# Patient Record
Sex: Male | Born: 1935 | Race: White | Hispanic: No | Marital: Married | Smoking: Never smoker
Health system: Southern US, Community
[De-identification: ages and names within clinical notes are randomized; demographics above are authoritative.]

## PROBLEM LIST (undated history)

## (undated) DIAGNOSIS — Z87828 Personal history of other (healed) physical injury and trauma: Secondary | ICD-10-CM

## (undated) DIAGNOSIS — I701 Atherosclerosis of renal artery: Secondary | ICD-10-CM

## (undated) DIAGNOSIS — R911 Solitary pulmonary nodule: Secondary | ICD-10-CM

## (undated) DIAGNOSIS — K08109 Complete loss of teeth, unspecified cause, unspecified class: Secondary | ICD-10-CM

## (undated) DIAGNOSIS — Z974 Presence of external hearing-aid: Secondary | ICD-10-CM

## (undated) DIAGNOSIS — E291 Testicular hypofunction: Secondary | ICD-10-CM

## (undated) DIAGNOSIS — N400 Enlarged prostate without lower urinary tract symptoms: Secondary | ICD-10-CM

## (undated) DIAGNOSIS — I6930 Unspecified sequelae of cerebral infarction: Secondary | ICD-10-CM

## (undated) DIAGNOSIS — E7800 Pure hypercholesterolemia, unspecified: Secondary | ICD-10-CM

## (undated) DIAGNOSIS — Z860100 Personal history of colon polyps, unspecified: Secondary | ICD-10-CM

## (undated) DIAGNOSIS — Z862 Personal history of diseases of the blood and blood-forming organs and certain disorders involving the immune mechanism: Secondary | ICD-10-CM

## (undated) DIAGNOSIS — Z8673 Personal history of transient ischemic attack (TIA), and cerebral infarction without residual deficits: Secondary | ICD-10-CM

## (undated) DIAGNOSIS — M1990 Unspecified osteoarthritis, unspecified site: Secondary | ICD-10-CM

## (undated) DIAGNOSIS — Z972 Presence of dental prosthetic device (complete) (partial): Secondary | ICD-10-CM

## (undated) DIAGNOSIS — Z955 Presence of coronary angioplasty implant and graft: Secondary | ICD-10-CM

## (undated) DIAGNOSIS — Z8672 Personal history of thrombophlebitis: Secondary | ICD-10-CM

## (undated) DIAGNOSIS — I779 Disorder of arteries and arterioles, unspecified: Secondary | ICD-10-CM

## (undated) DIAGNOSIS — R269 Unspecified abnormalities of gait and mobility: Secondary | ICD-10-CM

## (undated) DIAGNOSIS — I739 Peripheral vascular disease, unspecified: Secondary | ICD-10-CM

## (undated) DIAGNOSIS — Z800 Family history of malignant neoplasm of digestive organs: Secondary | ICD-10-CM

## (undated) DIAGNOSIS — C61 Malignant neoplasm of prostate: Secondary | ICD-10-CM

## (undated) DIAGNOSIS — Z86718 Personal history of other venous thrombosis and embolism: Secondary | ICD-10-CM

## (undated) DIAGNOSIS — Z8669 Personal history of other diseases of the nervous system and sense organs: Secondary | ICD-10-CM

## (undated) DIAGNOSIS — R32 Unspecified urinary incontinence: Secondary | ICD-10-CM

## (undated) DIAGNOSIS — R600 Localized edema: Secondary | ICD-10-CM

## (undated) DIAGNOSIS — I2510 Atherosclerotic heart disease of native coronary artery without angina pectoris: Secondary | ICD-10-CM

## (undated) DIAGNOSIS — N1330 Unspecified hydronephrosis: Secondary | ICD-10-CM

## (undated) HISTORY — DX: Family history of malignant neoplasm of digestive organs: Z80.0

## (undated) HISTORY — PX: CORONARY ANGIOPLASTY WITH STENT PLACEMENT: SHX49

## (undated) HISTORY — DX: Atherosclerosis of renal artery: I70.1

## (undated) HISTORY — PX: TONSILLECTOMY: SUR1361

## (undated) HISTORY — PX: TRANSLUMINAL ANGIOPLASTY: SHX274

## (undated) HISTORY — PX: CATARACT EXTRACTION W/ INTRAOCULAR LENS  IMPLANT, BILATERAL: SHX1307

## (undated) HISTORY — DX: Atherosclerotic heart disease of native coronary artery without angina pectoris: I25.10

## (undated) HISTORY — DX: Malignant neoplasm of prostate: C61

## (undated) HISTORY — DX: Pure hypercholesterolemia, unspecified: E78.00

## (undated) HISTORY — PX: CARDIOVASCULAR STRESS TEST: SHX262

## (undated) HISTORY — DX: Peripheral vascular disease, unspecified: I73.9

## (undated) HISTORY — DX: Solitary pulmonary nodule: R91.1

## (undated) HISTORY — DX: Disorder of arteries and arterioles, unspecified: I77.9

## (undated) HISTORY — PX: SUBDURAL HEMATOMA EVACUATION VIA CRANIOTOMY: SUR319

---

## 2000-06-10 ENCOUNTER — Encounter (INDEPENDENT_AMBULATORY_CARE_PROVIDER_SITE_OTHER): Payer: Self-pay | Admitting: Specialist

## 2000-06-10 ENCOUNTER — Other Ambulatory Visit: Admission: RE | Admit: 2000-06-10 | Discharge: 2000-06-10 | Payer: Self-pay | Admitting: Urology

## 2000-06-17 ENCOUNTER — Encounter: Admission: RE | Admit: 2000-06-17 | Discharge: 2000-06-17 | Payer: Self-pay | Admitting: Urology

## 2000-06-17 ENCOUNTER — Encounter: Payer: Self-pay | Admitting: Urology

## 2000-06-21 ENCOUNTER — Encounter: Admission: RE | Admit: 2000-06-21 | Discharge: 2000-09-19 | Payer: Self-pay | Admitting: Radiation Oncology

## 2001-02-27 ENCOUNTER — Ambulatory Visit (HOSPITAL_COMMUNITY): Admission: RE | Admit: 2001-02-27 | Discharge: 2001-02-27 | Payer: Self-pay | Admitting: Gastroenterology

## 2001-02-27 ENCOUNTER — Encounter (INDEPENDENT_AMBULATORY_CARE_PROVIDER_SITE_OTHER): Payer: Self-pay

## 2001-04-03 ENCOUNTER — Ambulatory Visit: Admission: RE | Admit: 2001-04-03 | Discharge: 2001-07-02 | Payer: Self-pay | Admitting: Radiation Oncology

## 2001-04-07 ENCOUNTER — Encounter: Payer: Self-pay | Admitting: Urology

## 2001-04-07 ENCOUNTER — Encounter: Admission: RE | Admit: 2001-04-07 | Discharge: 2001-04-07 | Payer: Self-pay | Admitting: Urology

## 2001-07-03 ENCOUNTER — Ambulatory Visit: Admission: RE | Admit: 2001-07-03 | Discharge: 2001-10-01 | Payer: Self-pay | Admitting: Radiation Oncology

## 2003-08-03 ENCOUNTER — Emergency Department (HOSPITAL_COMMUNITY): Admission: EM | Admit: 2003-08-03 | Discharge: 2003-08-03 | Payer: Self-pay | Admitting: Emergency Medicine

## 2003-08-04 ENCOUNTER — Inpatient Hospital Stay (HOSPITAL_COMMUNITY): Admission: EM | Admit: 2003-08-04 | Discharge: 2003-08-07 | Payer: Self-pay | Admitting: Emergency Medicine

## 2003-08-05 ENCOUNTER — Encounter (INDEPENDENT_AMBULATORY_CARE_PROVIDER_SITE_OTHER): Payer: Self-pay | Admitting: Cardiology

## 2003-08-07 ENCOUNTER — Inpatient Hospital Stay (HOSPITAL_COMMUNITY)
Admission: RE | Admit: 2003-08-07 | Discharge: 2003-08-27 | Payer: Self-pay | Admitting: Physical Medicine & Rehabilitation

## 2003-09-03 ENCOUNTER — Encounter
Admission: RE | Admit: 2003-09-03 | Discharge: 2003-12-02 | Payer: Self-pay | Admitting: Physical Medicine & Rehabilitation

## 2003-10-03 ENCOUNTER — Encounter
Admission: RE | Admit: 2003-10-03 | Discharge: 2004-01-01 | Payer: Self-pay | Admitting: Physical Medicine & Rehabilitation

## 2004-01-16 ENCOUNTER — Encounter
Admission: RE | Admit: 2004-01-16 | Discharge: 2004-04-15 | Payer: Self-pay | Admitting: Physical Medicine & Rehabilitation

## 2006-02-21 ENCOUNTER — Ambulatory Visit: Payer: Self-pay | Admitting: Pulmonary Disease

## 2006-02-21 ENCOUNTER — Inpatient Hospital Stay (HOSPITAL_COMMUNITY): Admission: EM | Admit: 2006-02-21 | Discharge: 2006-03-01 | Payer: Self-pay | Admitting: Emergency Medicine

## 2006-02-23 ENCOUNTER — Encounter (INDEPENDENT_AMBULATORY_CARE_PROVIDER_SITE_OTHER): Payer: Self-pay | Admitting: Cardiology

## 2007-09-21 ENCOUNTER — Emergency Department (HOSPITAL_COMMUNITY): Admission: EM | Admit: 2007-09-21 | Discharge: 2007-09-21 | Payer: Self-pay | Admitting: Emergency Medicine

## 2007-10-17 ENCOUNTER — Inpatient Hospital Stay (HOSPITAL_COMMUNITY): Admission: RE | Admit: 2007-10-17 | Discharge: 2007-10-18 | Payer: Self-pay | Admitting: Cardiology

## 2007-10-19 ENCOUNTER — Ambulatory Visit: Payer: Self-pay | Admitting: Vascular Surgery

## 2007-10-19 ENCOUNTER — Encounter (INDEPENDENT_AMBULATORY_CARE_PROVIDER_SITE_OTHER): Payer: Self-pay | Admitting: Cardiology

## 2007-10-19 ENCOUNTER — Ambulatory Visit (HOSPITAL_COMMUNITY): Admission: RE | Admit: 2007-10-19 | Discharge: 2007-10-19 | Payer: Self-pay | Admitting: Cardiology

## 2007-10-20 ENCOUNTER — Encounter: Admission: RE | Admit: 2007-10-20 | Discharge: 2007-10-20 | Payer: Self-pay | Admitting: Geriatric Medicine

## 2007-11-09 ENCOUNTER — Inpatient Hospital Stay (HOSPITAL_COMMUNITY): Admission: AD | Admit: 2007-11-09 | Discharge: 2007-11-10 | Payer: Self-pay | Admitting: Interventional Cardiology

## 2008-04-29 ENCOUNTER — Encounter: Admission: RE | Admit: 2008-04-29 | Discharge: 2008-04-29 | Payer: Self-pay | Admitting: Geriatric Medicine

## 2008-06-30 ENCOUNTER — Emergency Department (HOSPITAL_COMMUNITY): Admission: EM | Admit: 2008-06-30 | Discharge: 2008-06-30 | Payer: Self-pay | Admitting: Emergency Medicine

## 2009-02-27 HISTORY — PX: TRANSTHORACIC ECHOCARDIOGRAM: SHX275

## 2009-11-13 IMAGING — CT CT CHEST W/O CM
2 of 3 series · 15 of 36 positions shown, 18 images · non-contrast
Comparison: None

CLINICAL DATA: Follow up right middle lobe nodule.

CT CHEST WITHOUT CONTRAST
TECHNIQUE: Multidetector CT imaging of the chest was performed
following the standard protocol without IV contrast.

[Series 3: routine chest · axial · 0.70mm/px · z∈[-318,-58]mm · 12 of 62 slices shown, 15 images]
[im 5/62  mediastinal]
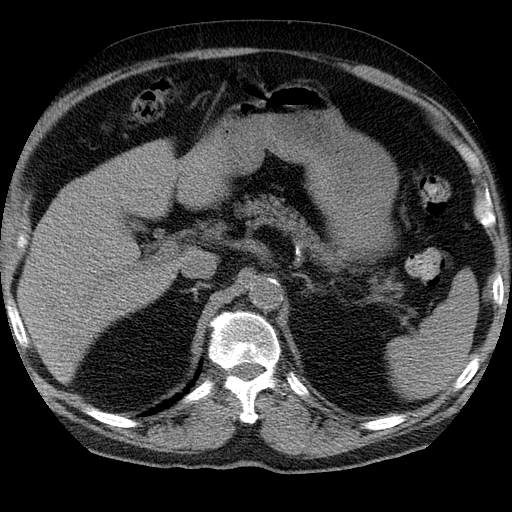
[im 5/62  lung]
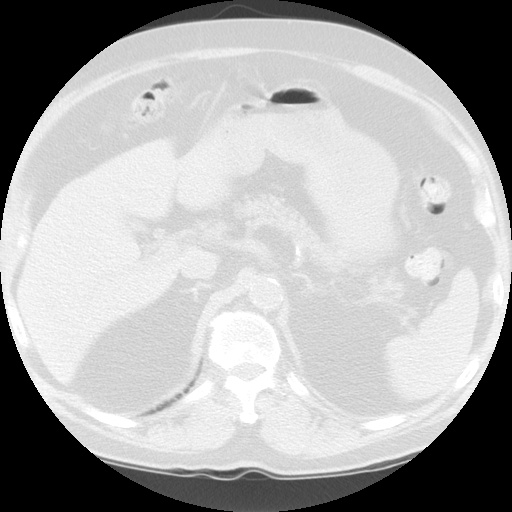
[im 10/62  lung]
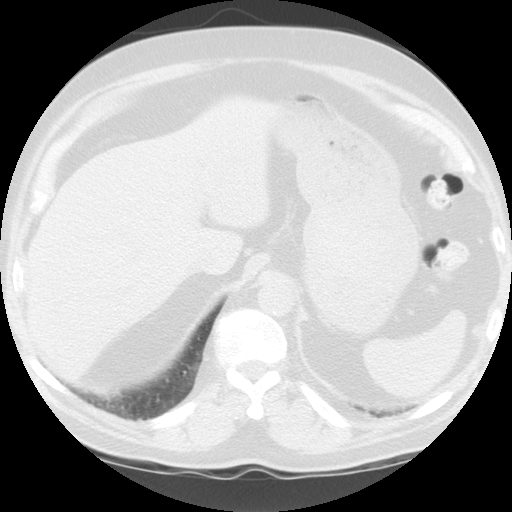
[im 14/62  lung]
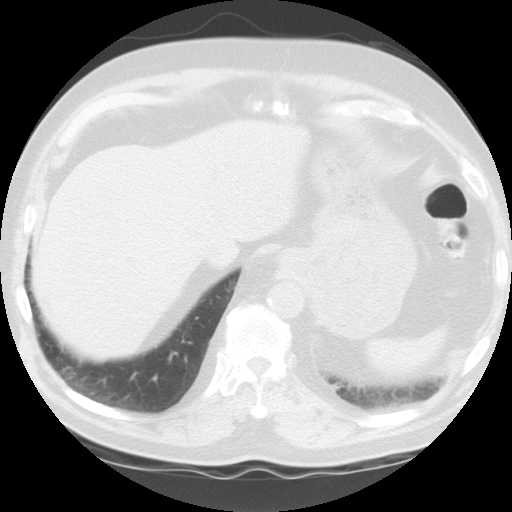
[im 19/62  lung]
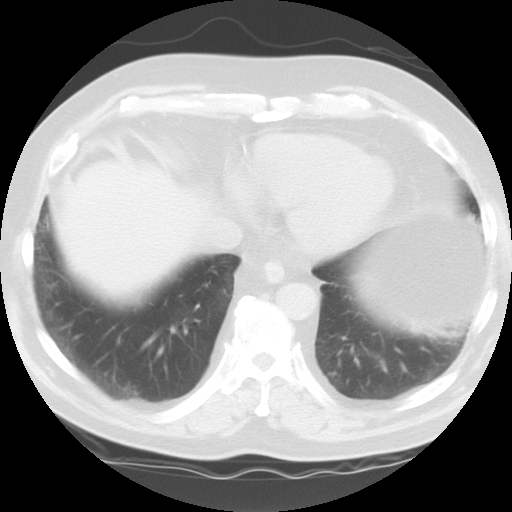
[im 23/62  mediastinal]
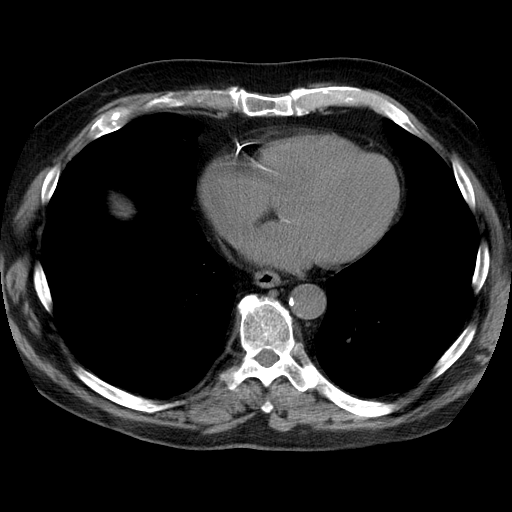
[im 23/62  lung]
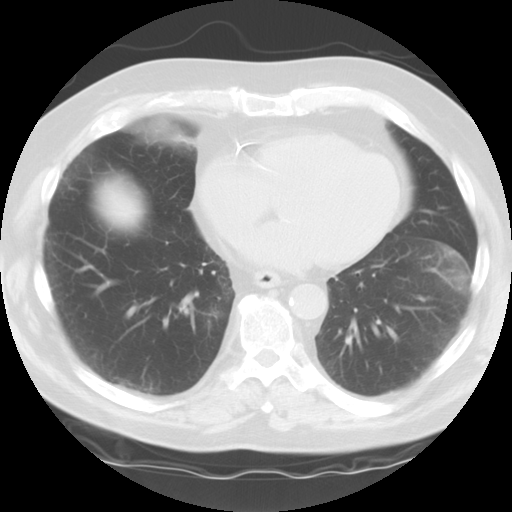
[im 28/62  lung]
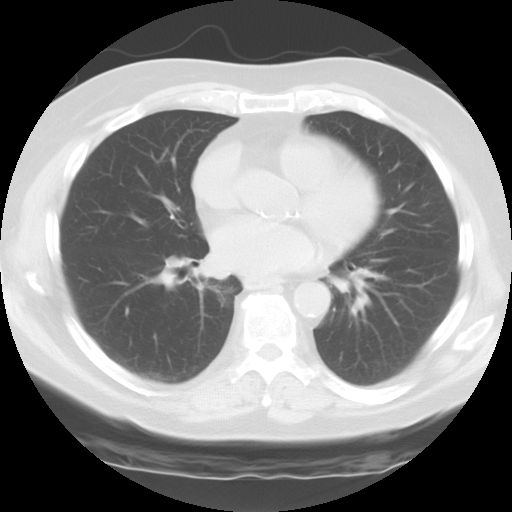
[im 34/62  lung]
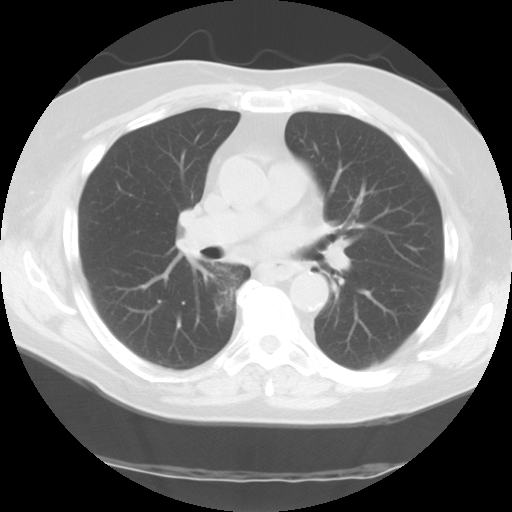
[im 39/62  lung]
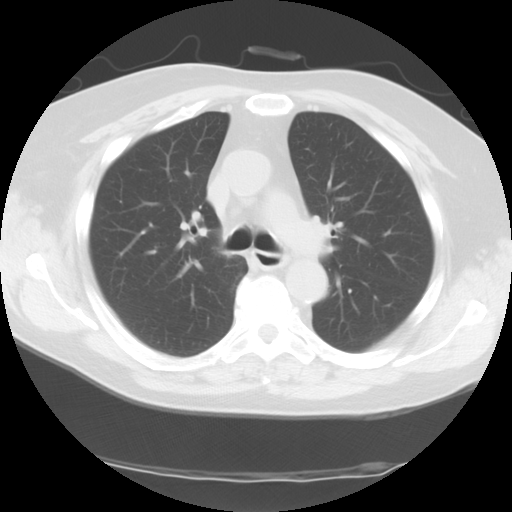
[im 43/62  mediastinal]
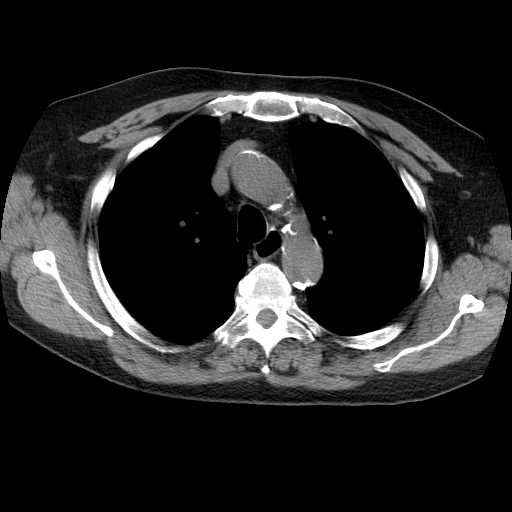
[im 43/62  lung]
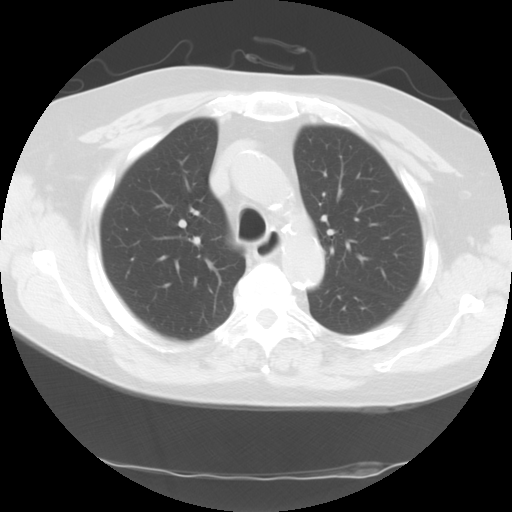
[im 48/62  lung]
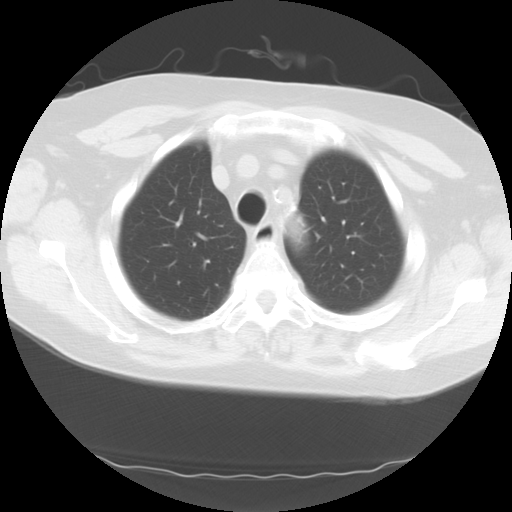
[im 52/62  lung]
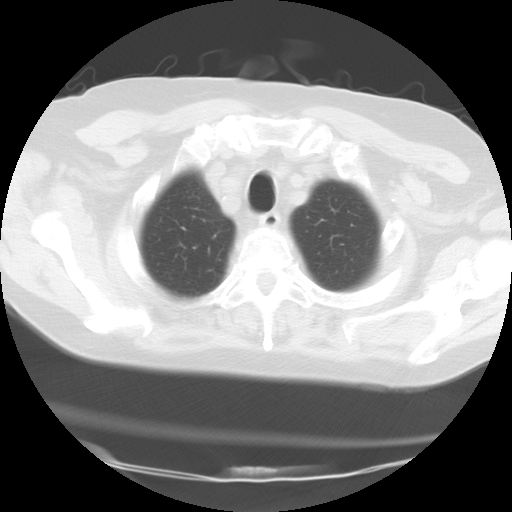
[im 57/62  lung]
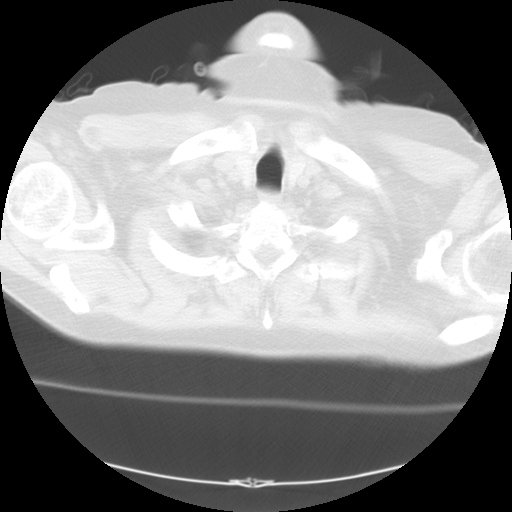

[Series 602: sagittal body · sagittal · 0.70mm/px · 3 of 145 slices shown]
[im 29/145  lung]
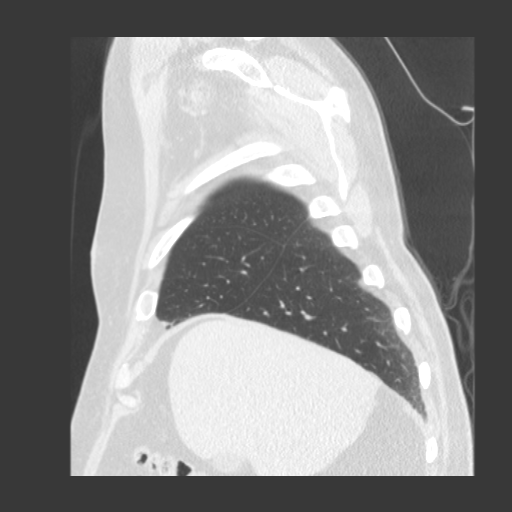
[im 58/145  lung]
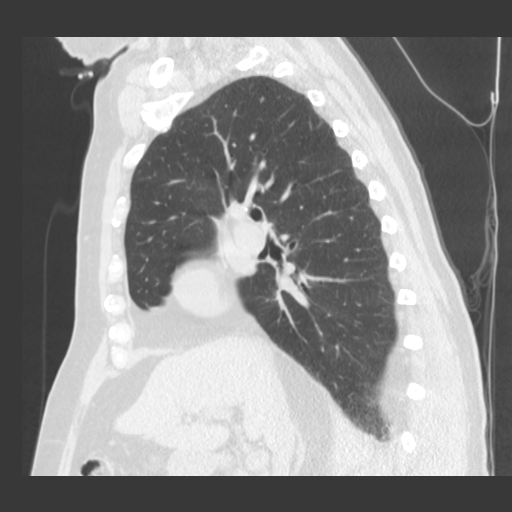
[im 87/145  lung]
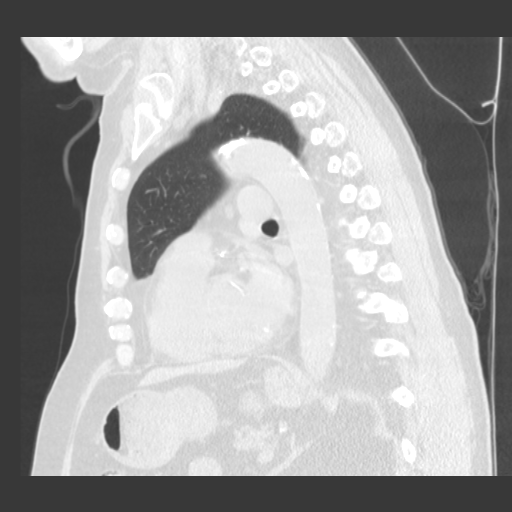

[15 of 36 positions shown; findings below may reference images not displayed]

FINDINGS: No enlarged axillary lymph nodes.

There are no enlarged supraclavicular lymph nodes

Calcified right paratracheal lymph node is noted.

There is advanced calcified atherosclerotic disease affecting the
coronary arteries and the thoracic aorta.

No pathologically enlarged mediastinal or hilar lymph nodes are
noted.

There is no pericardial or pleural fluid

The left lung is clear.

There is a nodule within the right middle lobe which measures
mm, image 36.

Right upper lobe nodule measures 5.7 mm.

There is a calcified granuloma in the right upper lobe, image
number 31.

Review of the visualized osseous structures shows thoracic
spondylosis.
IMPRESSION: 1.  No acute findings.
2.  Right middle lobe nodule measures 5.7 mm.  If this patient is
at low risk for lung cancer I would recommend a follow-up CT at 12
months.  If this patient is at high risk for lung cancer an initial
follow-up exam in 6 months is recommended.

## 2010-07-14 ENCOUNTER — Other Ambulatory Visit (HOSPITAL_COMMUNITY): Payer: Self-pay | Admitting: Geriatric Medicine

## 2010-07-14 DIAGNOSIS — K043 Abnormal hard tissue formation in pulp: Secondary | ICD-10-CM

## 2010-07-30 ENCOUNTER — Ambulatory Visit (HOSPITAL_COMMUNITY)
Admission: RE | Admit: 2010-07-30 | Discharge: 2010-07-30 | Disposition: A | Discharge status: Home / Self Care | Payer: Medicare Other | Source: Ambulatory Visit | Attending: Geriatric Medicine | Admitting: Geriatric Medicine

## 2010-07-30 DIAGNOSIS — M533 Sacrococcygeal disorders, not elsewhere classified: Secondary | ICD-10-CM | POA: Insufficient documentation

## 2010-07-30 DIAGNOSIS — Z8546 Personal history of malignant neoplasm of prostate: Secondary | ICD-10-CM | POA: Insufficient documentation

## 2010-07-30 DIAGNOSIS — K043 Abnormal hard tissue formation in pulp: Secondary | ICD-10-CM

## 2010-07-30 MED ORDER — TECHNETIUM TC 99M MEDRONATE IV KIT
25.0000 | PACK | Freq: Once | INTRAVENOUS | Status: AC | PRN
  Administered 2010-07-30: 27.5 via INTRAVENOUS

## 2010-09-14 LAB — COMPREHENSIVE METABOLIC PANEL
ALT: 31 U/L (ref 0–53)
AST: 26 U/L (ref 0–37)
CO2: 27 mEq/L (ref 19–32)
Chloride: 104 mEq/L (ref 96–112)
GFR calc Af Amer: >60 mL/min (ref >60)
GFR calc non Af Amer: 59 mL/min — ABNORMAL LOW (ref >60)
Sodium: 139 mEq/L (ref 135–145)
Total Bilirubin: 0.6 mg/dL (ref 0.3–1.2)

## 2010-09-14 LAB — URINALYSIS, ROUTINE W REFLEX MICROSCOPIC
Glucose, UA: NEGATIVE mg/dL
Hgb urine dipstick: NEGATIVE
Ketones, ur: NEGATIVE mg/dL
Protein, ur: NEGATIVE mg/dL

## 2010-09-14 LAB — POCT CARDIAC MARKERS
CKMB, poc: <1 ng/mL — ABNORMAL LOW (ref 1.0–8.0)
Troponin i, poc: <0.05 ng/mL (ref 0.00–0.09)
Troponin i, poc: <0.05 ng/mL (ref 0.00–0.09)

## 2010-09-14 LAB — CBC
MCV: 92.4 fL (ref 78.0–100.0)
RBC: 4.2 MIL/uL — ABNORMAL LOW (ref 4.22–5.81)
WBC: 8.3 10*3/uL (ref 4.0–10.5)

## 2010-10-13 NOTE — Op Note (Signed)
NAMEKOLBE, DELMONACO NO.:  [id number]   MEDICAL RECORD NO.:  [id number]          PATIENT TYPE:  INP   LOCATION:  6525                         FACILITY:  [facility]   PHYSICIAN:  Corky Crafts, MDDATE OF BIRTH:  [birth year]   DATE OF PROCEDURE:  11/09/2007  DATE OF DISCHARGE:                               OPERATIVE REPORT   PROCEDURE:  Abdominal aortogram.  Bilateral renal angiogram with PTA  stents to bilateral renal arteries.   REFERRING PHYSICIAN:  Francisca December, MD   PROCEDURES PERFORMED:  1. Abdominal aortogram.  2. Bilateral renal angiogram.  3. Bilateral renal percutaneous transluminal angioplasty/stent.   OPERATOR:  Corky Crafts, MD   INDICATIONS:  Hypertension and renal insufficiency.   PROCEDURE NOTE:  The patient was brought to the [facility] lab with diagnostic  angiogram had been performed by Dr. Amil Amen several weeks ago showing  bilateral renal artery stenosis with pressure damping.  A 6-French  sheath was placed in the right femoral artery, a pigtail catheter was  advanced into the into the abdominal aorta and power injection of  contrast was performed in the AP projection to image the infrarenal  aorta.  A 6-French short IMA guide was then advanced and placed into the  ostium of the right renal artery.  A stabilizer wire was placed across  the lesion after the 80% ostial lesion and pressure damping was noted.  A 5.0 x 15 mm Viatrac balloon was inflated to 8 atmospheres for 20  seconds and then 10 atmospheres for 30 seconds.  A 6.0 x 15 Herculink  stent was then placed into the ostium of the right renal artery and  deployed at 10 atmospheres for 40 seconds.  The proximal portion of the  stent was flared at 12 atmospheres for 10 seconds.  The guide was then  placed into the ostium of the left renal artery, the same 5.0 x 15  Viatrac balloon was inflated across the lesion to 10 atmospheres for 20  seconds.  This was a calcified 70% lesion  and there is approximately 40-  mm gradient prior to the angioplasty.  A 6.0 x 15-mm Herculink stent was  then placed across the lesion and deployed at 8 atmospheres for 20  seconds and ostium was ballooned at 10 atmospheres for 15 seconds.  There is an excellent angiographic result.   IMPRESSION:  Successful 6.0 x 15 stents placed into the ostium of both  the right and left renal arteries with excellent angiographic results.   RECOMMENDATIONS:  We will watch the patient overnight and hydrate him  aggressively.  We will consider starting an ACE inhibitor at a later  time if his blood pressure still requires medication.  A sheath will be  pulled using manual compression.  Heparin was used for anticoagulation.  He was planned to be discharged tomorrow.      Corky Crafts, MD  Electronically Signed     JSV/MEDQ  D:  11/09/2007  T:  11/09/2007  Job:  [id number]   cc:   Hal T. Stoneking, M.D.
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
## 2010-10-16 NOTE — Discharge Summary (Signed)
[name], [name]                 ACCOUNT NO.:  [id number]   MEDICAL RECORD NO.:  [id number]          PATIENT TYPE:  INP   LOCATION:  2006                         FACILITY:  [facility]   PHYSICIAN:  Hollice Espy, M.D.DATE OF BIRTH:  Jul 01, 1921   DATE OF ADMISSION:  02/21/2006  DATE OF DISCHARGE:  03/01/2006                                 DISCHARGE SUMMARY   PRIMARY CARE PHYSICIAN:  Dr. Ann Maki T. Stoneking.   CONSULTANTS ON THIS CASE:  Dr.  Danice Goltz,  critical care medicine, and  Dr. Verdis Prime, cardiology.   DISCHARGE DIAGNOSES:  1. Retroperitoneal bleed.  2. Supertherapeutic Coumadin level causing #1.  3. Syncope secondary to #1.  4. Episode of ventricular fibrillation.  5. Hypokalemia.  6. History of hyperlipidemia.  7. History of hypertension.  8. History of transient ischemic attack.  9. History of deep venous thrombosis and phlebitis leading to chronic      Coumadin therapy.  10.Hearing loss.  11.History of head injury.   DISCHARGE MEDICATIONS:  Patient will be stopping his Coumadin medication at  this time.  Whether he will be continuing this medication will be re-  determined by his PCP at a later date.  He will resume the rest of his  medications.  These are as follows:  1. Aspirin 81 p.o. daily.  2. Zetia 10 p.o. daily.  3. Hydrochlorothiazide 25 p.o. daily.  4. Lipitor 40 p.o. daily.  5. Altace 5 mg p.o. daily.   HOSPITAL COURSE:  The patient is an 77 year old white male with past medical  history of DVT and thrombophlebitis, on chronic Coumadin therapy, who  according to his PCP has always done very well with Coumadin.  He has been  on Coumadin now for almost 30 years.  His INR has always stayed very close  to almost 2.4 at the same dose.  For some unknown reason, unbeknownst to  them, his INR greatly elevated -  up to 10.2, even though it had been  checked a week to a week and a half earlier and was normal.  He had a  syncopal episode on the day of  admission, by his wife.  He was sitting on  the steps when he became nauseated, felt clammy, and slumped over.  Patient  was brought to the emergency room and was found to have an INR of 10.2, and  after being evaluated, had a hemoglobin of 1.7.  He was also complaining of  some abdominal pain and with a negative KUB, there was a concern that  perhaps he was having an episode of bleeding.  Patient underwent a CT scan  which showed evidence of a retroperitoneal bleed.  The patient was admitted  and was started on FFP and vitamin K.   HOSPITAL COURSE:  Regarding the patient's retroperitoneal bleed, patient  continued to receive units of blood as well as doses of FFP.  He eventually  tolerated this procedure well.  He was initially intubated because of an  episode of dysrhythmia described below, but from a bleeding standpoint, did  well.  He remained  stable, and once his H&H was recovered, he tolerated this  well.  He remained stable during his hospitalization and had no further  episodes of bleeding.  I have discussed with the family.  We have agreed  that he will for now stop Coumadin and whether he will restart on this will  be determined by his PCP at a later date.  As far as the cause of the INR  toxicity, there is no clear etiology why.  He had been on no new  medications, had no liver issues, and it is still unclear, but again for now  we will stop his Coumadin.  He has had no further problem.   In regard to patient's episode of ventricular Fibrillation, patient was  receiving FFP on hospital day #1 when all of a sudden he went into  ventricular fibrillation for 2 minutes.  A code was called and he was  shocked at 120 joules and given oxygen.  He immediately returned back to his  normal sinus rate.  Because of this episode, the V-fib plus retroperitoneal  bled, it was felt that he would best be intubated for his own protection.  This was done by anesthesia.  Patient was initially  placed on a ventilator.  By hospital day #2, he was able to be extubated without any complications.  He remained in normal sinus rhythm during the rest of the entire  hospitalization.  The cause of it was felt to be secondary to hypokalemia.  Patient initially on presentation had a potassium of 3.5 but re-do potassium  showed a level of 2.7.  It was suspected that perhaps with the large amount  of IV fluid that he was given as well as being hypervolemic and having  decreased blood supply may have contributed to this.  Again, he remained in  normal sinus rhythm, and once he was stabilized, he was able to undergo a  cardiac stress test, adenosine Cardiolite on March 01, 2006.  At this time  I am waiting for the results to come back and if the stress test is  negative, the plan will be to discharge the patient to home.   The rest of patient's medical issues including history of TIA, hypertension,  hyperlipidemia, all remained stable during his hospitalization, and these  were stable.   PLAN:  To discharge patient to home if his stress test is negative.  His  diet will be a heart-healthy diet.  His activity will be as tolerated.  He  was evaluated by PT/OT and found to be at his baseline, not needing any home  health.   DISPOSITION:  Improved, and he will be discharged to home pending clearance  of his stress test.      Hollice Espy, M.D.  Electronically Signed     SKK/MEDQ  D:  03/01/2006  T:  03/02/2006  Job:  [id number]   cc:   Hal T. Stoneking, M.D.  Lyn Records, M.D.  Dr.  ****, critical care medicine

## 2010-10-16 NOTE — Assessment & Plan Note (Signed)
MEDICAL RECORD NUMBER:  [id number]   Mr. [name] is here in follow-up of his right parietal infarct with left  hemiparesis.  He was discharged from [facility] on August 27, 2003, and preceded to go to outpatient therapies which he is still  involved with.  He has been doing quite well at home.  He is walking with  his solid AFO.  He has been doing some trials in ambulation without the  brace in therapy.  He does not walk without this at home.  He denies any  falls or dizziness.  He has had no new numbness, weakness, or spasms.  Bowel  and bladder function have been normal.  Mood has been good.  There are no  visual complaints. He does state that he fatigues fairly easily.  He has  complained of some left hip pain although this is chronic.  Left shoulder  has been bothersome particularly intrascapularly as well as subacromially.  He is not really using anything for pain at this point.   REVIEW OF SYMPTOMS:  The patient denies any chest pain, shortness of breath,  cold or flu symptoms.  He denies any headaches, mental status changes or  mood changes.  Denies any nausea, vomiting, reflux, bowel or bladder  difficulties.  No weight loss, fever or chills.  He has added some weight  since discharge.   PHYSICAL EXAMINATION:  VITAL SIGNS:  Blood pressure is 146/43, pulse 8,  respiratory rate 14, oxygen saturation 96% on room air.   The patient ambulated today with his brace and had some circumduction in his  gait and was very straight legged in swing phase.  He had minimal toe lift.  Was slightly wide based in style.  He was fairly stable and steady, however.  We removed the brace and the patient walked today with cues, was better with  ankle dorsiflexion and knee bend and seemed to be a bit more fluid.  Muscle  strength on the left side was 3+ to 4/5 in the leg, essentially the same in  the left arm with a little less strength in the left hand.  He was  restricted in range of  motion with intrinsic use due to some slight  swelling.  Left ankle could be moved passively to about 90 degrees.  He had  clonus notable there with multiple beats.  Reflexes were 3+ on the left  side.  Overall, tone was 0 to 1/4.  Sensory examination was generally  intact.  Cranial nerve examination was appropriate with no focal findings.  He may have had a mild left central seventh.  Cognition was intact.  Left  shoulder was ranged today and he had some impingement with passive abduction  and was blocking at about 90 degrees.  Internal rotation and external  rotation caused some discomfort today.  He had trigger point over the  trapezius muscle along the medial border of the scapula on the left side.  Neck range of motion was good.  Left hip revealed no palpable tenderness.  Greater trochanter region was nontender.  He had some pain with internal and  external rotation.  No obvious asymmetries are noted today.   ASSESSMENT:  1. Right parietal infarct.  2. Gait disorder.  3. Hypertension.  4. Osteoarthritis of the left hip.  5. Mild myofascial pain in the left trapezius.   PLAN:  1. The patient will continue with his outpatient therapy at this point.  I  would like to see him transition off of his AFO over the next few weeks'     time.  2. I will discuss some ankle stretching exercises as well for his mild heel     cord contracture.  3. We considered a trigger point injection to the left trapezius today but     will hold off and pursue range of motion and strengthening through     therapy on the left side.  4. Recommended Tylenol prior to ambulation for his degenerative hip disease.     Hopefully this will improve     somewhat as his gait stabilizes.  5. I will see him back in about three months' time.  I did not give him     permission to drive today.      [name], M.D.   ZTS/MedQ  D:  10/07/2003 12:56:53  T:  10/07/2003 13:32:02  Job #:  [id number]   cc:   Hal  T. Stoneking, M.D.  [address] [place], [place] [address]  Fax: [phone]
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
## 2010-10-16 NOTE — Procedures (Signed)
[facility]  Patient:    [name], [name] Visit Number: [id number] MRN: [id number]          Service Type: Attending:  Verlin Grills, M.D. Dictated by:   Verlin Grills, M.D. Proc. Date: 02/27/01   CC:         Hal T. Stoneking, M.D.   Procedure Report  PROCEDURE:  Colonoscopy and rectal polypectomy.  REFERRING PHYSICIAN:  Hal T. Stoneking, M.D.  INDICATIONS FOR PROCEDURE:  The patient (date of birth, [birth year]) is a 77 year old male who is referred for his first colonoscopy with polypectomy to prevent colon cancer.  The patients brother has undergone surgery for colon cancer.  In 1995, the patient underwent a flexible proctosigmoidoscopy and air contrast barium enema.  I discussed with the patient the complications associated with colonoscopy and polypectomy including a 15 per 1000 risk of bleeding, and 4 per 1000 risk of colonic perforation requiring surgical repair.  The patient has signed the operative permit.  ENDOSCOPIST:  Verlin Grills, M.D.  PREMEDICATION:  Versed 5 mg and Demerol 50 mg.  ENDOSCOPE:  Olympus pediatric colonoscope.  DESCRIPTION OF PROCEDURE:  After obtaining informed consent, the patient was placed in the left lateral decubitus position.  I administered intravenous Versed and intravenous Demerol to achieve conscious sedation for the procedure.  The patients blood pressure, oxygen saturation, and cardiac rhythm were monitored throughout the procedure and documented in the medical record.  Anal inspection was normal.  Digital rectal exam revealed a non-nodular prostate.  The patient does have prostate cancer diagnosed by Dr. Darvin Neighbours.  The Olympus pediatric colonoscope was introduced into the rectum and easily advanced to the cecum.  Colonic preparation for the exam today was excellent.  Rectum:  From the distal rectum, a 0.5 mm sessile polyp was removed with the cold biopsy forceps and  submitted for pathological interpretation.  Sigmoid colon and descending colon normal.  Splenic flexure normal.  Transverse colon normal.  Hepatic flexure normal.  Ascending colon normal.  Cecum and ileocecal valve normal.  ASSESSMENT:  From the distal rectum, a 0.5 mm sessile polyp was removed with the cold biopsy forceps.  Otherwise, normal proctocolonoscopy to the cecum.  RECOMMENDATIONS:  Repeat colonoscopy in approximately five years. Dictated by:   Verlin Grills, M.D. Attending:  Verlin Grills, M.D. DD:  02/27/01 TD:  02/27/01 Job: [id number]

## 2010-10-16 NOTE — Consult Note (Signed)
NAMEDRAYVEN, MARCHENA                 ACCOUNT NO.:  [id number]   MEDICAL RECORD NO.:  [id number]          PATIENT TYPE:  INP   LOCATION:  2902                         FACILITY:  [facility]   PHYSICIAN:  Ulyses Amor, MD DATE OF BIRTH:  09/18/1921   DATE OF CONSULTATION:  02/23/2006  DATE OF DISCHARGE:                                   CONSULTATION   TYPE OF CONSULTATION:  Cardiology.   HISTORY OF PRESENT ILLNESS:  [name] is an 77 year old, white man who  was initially admitted to [facility] 2 days ago because of a  syncopal episode.  In the course of evaluation he was found to be over  anticoagulated with an INR of greater than 10.2.  His hemoglobin and  hematocrit were 11.9 and 35 respectively.  Imaging studies today  demonstrated evidence of a retroperitoneal bleed.  He was in the process of  receiving fresh frozen plasma this evening when he suffered a witnessed and  telemetry documented ventricular fibrillation cardiac arrest.  One shock  converted him to normal sinus rhythm.  He remains in normal sinus rhythm and  otherwise stable at this point.  A cardiology consultation was requested in  order to assist in his management.   The patient has no past history of cardiac disease, including no history of  myocardial infarction, coronary artery disease, congestive heart failure, or  arrhythmia.   PAST MEDICAL HISTORY:  He has a prior history of a stroke.  Other medical  problems include hypertension and dyslipidemia.   MEDICATIONS:  Aspirin, Coumadin, Zetia, hydrochlorothiazide, Lipitor,  Altace.   ALLERGIES:  NONE.   REVIEW OF SYSTEMS:  Not obtained.   FAMILY HISTORY:  Not obtained.   PHYSICAL EXAMINATION:  VITAL SIGNS:  Blood pressure 124/58.  Pulse is 90 and  regular.  Respirations 17.  Temperature 96.8.  Pulse oximetry 98% on 2  liters.  GENERAL:  The patient was a sedated, elderly white man in no distress.  HEENT:  Head, eyes, nose, and mouth were  normal.  NECK:  Without thyromegaly or adenopathy.  Carotid pulses were palpable  bilaterally.  CARDIAC:  Normal S1 and S2.  There was no S3, S4, murmur, rub, or click.  Cardiac rhythm is regular.  No chest wall tenderness was noted.  LUNGS:  Clear.  ABDOMEN:  Soft and nontender.  There was mass, hepatosplenomegaly or bruit,  distention, rebound, guarding or rigidity.  EXTREMITIES:  Without edema, deviation or deformity.  Radial and dorsalis  pedal pulses were palpable bilaterally.  NEUROLOGIC:  Re-screening neurologic survey was unremarkable.   DIAGNOSTIC DATA:  The electrocardiogram revealed normal sinus rhythm.  The  possibility of an anterior myocardial infarction could not be excluded based  on the loss of R wave height between V2 and V3.   IMPRESSION:  1. Ventricular fibrillation cardiac arrest.  Possibly related to his      potassium today of 2.8.  It is possible an arrhythmia was responsible      for his syncopal episode (the reason for admission).  2. Over anticoagulation.  3. Retroperitoneal  bleed.   RECOMMENDATIONS:  1. Cardiac enzymes.  2. Correct potassium; should be between 4.0 and 5.0.  3. Check magnesium; correct if needed.  4. Echocardiogram to assess left ventricular function.  5. No specific antiarrhythmic therapy (such as lidocaine or amiodarone) at      this time; would see if he has further arrhythmias once his potassium      has been corrected.  6. Dr. Katrinka Blazing will follow with you.      Ulyses Amor, MD  Electronically Signed     MSC/MEDQ  D:  02/23/2006  T:  02/23/2006  Job:  [id number]   cc:   Ulyses Amor, MD  Lyn Records, M.D.
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
## 2010-10-16 NOTE — Discharge Summary (Signed)
NAME:  [name], [name]                           ACCOUNT NO.:  [id number]   MEDICAL RECORD NO.:  [id number]                   PATIENT TYPE:  INP   LOCATION:  3037                                 FACILITY:  [facility]   PHYSICIAN:  Kela Millin, M.D.             DATE OF BIRTH:  10-Aug-1921   DATE OF ADMISSION:  08/04/2003  DATE OF DISCHARGE:  08/07/2003                                 DISCHARGE SUMMARY   DISCHARGE DIAGNOSES:  1. Right pontine infarction - with left hemiparesis.  2. Internal carotid artery stenosis - left 60 to 80% greater than right,     which is 40 to 60%.  3. Hypertension.  4. Hyperlipidemia.  5. Chronic phlebitis.   PROCEDURE:  1. MRI/MRA done on August 04, 2003.  The MRI showed atrophy and chronic small     vessel ischemic changes and no acute infarct or acute abnormalities were     seen in that study.  The MRA showed moderate stenosis of the left     anterior cerebral artery and it was negative for aneurysm.  2. MRA of the neck - This showed severe stenosis of the origin of the left     common carotid artery with irregularity and mild to moderate stenosis of     the proximal left internal carotid artery.  3. Carotid Dopplers - Internal carotid artery stenosis with the left 60 to     80% stenosis greater than right, which is 40 to 60% in that internal     carotid artery.  4. A 2D echo - No echographic evidence of cardiac __________ embolism.   CONSULTATIONS:  Neurology, Dr. Pearlean Brownie.   HISTORY:  The patient is a 77 year old white male with past medical history  significant for hypertension, hyperlipidemia and remote history of a TIA who  presented with complaints of left-sided weakness and slurred speech.  The  patient had been to the emergency room on the day prior to admission, August 03, 2003, with slurred speech and some difficulty with his balance.  A CT  scan of the brain was done on that day and it showed an old frontal CVA, but  no acute findings.  His INR  on Coumadin was 2.3 and the patient improved  significantly towards his baseline that day and was discharged from the  emergency room that afternoon.  He did okay that night, except he continued  to have problems with his balance.  When he woke up on the day of admission,  he again had problems with weakness in the left arm and leg with difficulty  walking or using his left arm to brush his teeth.  His speech was also  slurred and he came back to the emergency room.  He had a mild headache,  which resolved.  He denied visual changes, numbness, paresthesias, chest  pain, shortness of breath, abdominal pain.  He also denied  hematuria and  hematochezia.   INITIAL PHYSICAL EXAMINATION:  VITAL SIGNS:  Blood pressure 134/57.  HEENT:  There was some mild decrease in his gag reflex on both sides.  EXTREMITIES:  It was noted he had hyperpigmentation in the lower legs.  NEUROLOGIC:  He was alert and oriented times three.  His speech was mildly  slurred and the cranial nerves were intact, except for mild left lower  facial weakness.  His tongue deviated slightly to the left.  The motor exam  showed the strength on his right upper and right lower extremities was 5/5  and the strength in the left upper and left lower extremities noted on that  initial exam was 5-/5 and the reflexes 2+/4 and toes were downgoing on that  exam.   SUBSEQUENT PHYSICAL EXAMINATION:  NEUROLOGIC:  After the patient was  admitted, the strength in the left upper and left lower extremities had  decreased to 1 to 2/5 in both left upper and right lower extremities.   His homocystine level was 9.88 which is within normal limits and his total  cholesterol was 180, the triglycerides 191, and his HDL was 34 with an LDL  of 108.   HOSPITAL COURSE:  PROBLEM 1.  Right pontine infarction - Upon admission, the  patient was continued on his Coumadin and aspirin was added.  An MRI and MRA  of the brain were ordered and the results were  as stated above.  The studies  were reviewed by the neurologist and they stated that this was consistent  with pontine ischemia/infarction.  A 2D echocardiogram as well as carotid  Dopplers were ordered as well and the results were as stated above.  The  patient's lipids were also rechecked and neurology recommended that his  Lipitor be increased and this was increased to 40 mg a day before he was  discharged to rehab.  Neurology was consulted, as mentioned already, and  physical therapy, occupational therapy as well as speech therapy were  consulted and they saw the patient during his hospital stay.  The patient  was maintained on his entire antihypertensive HCTZ throughout his hospital  stay for blood pressure control.  His INR was also therapeutic throughout  his hospital stay.  The patient had been tolerating p.o. and had remained  hemodynamically stable and rehab was consulted and the patient accepted.  [name] will be discharged to rehab at this time.  The strength in his  left upper and left lower extremities ranges from 1 to 2+/5.  PROBLEM 2.  Internal carotid artery stenosis - Left 60 to 80%, which is  greater than the right, which is 40 to 60%.  The patient is asymptomatic  from this at this time.  He will be following up with Dr. Pearlean Brownie, a  neurologist, in two months and this will be further evaluated and managed as  clinically appropriate.  PROBLEM 3.  Hypertension - The patient was maintained on hydrochlorothiazide  for blood pressure control during his hospital stay.  PROBLEM 4.  Hyperlipidemia - As stated above, Lipitor increased to 40 mg  q.d.  PROBLEM 5.  Chronic phlebitis - The patient is maintained on Coumadin.   DISCHARGE CONDITION:  Stable.   DISCHARGE DIET:  Dysphagia __________ soft diet.   DISCHARGE MEDICATIONS:  1. Aspirin 81 mg p.o. q.d.  2. Lipitor 40 mg one p.o. q.d.  3. Hydrochlorothiazide 25 mg one p.o. q.d. 4. Protonix 40 mg p.o. q.d.  5. K-Dur 20  mEq one p.o. b.i.d.  6. Coumadin 4 mg p.o. q.p.m.  7. Tylenol 650 mg one every 4 hour p.r.n.  8. Senokot one p.o. q.h.s. p.r.n.   FOLLOW-UP CARE:  1. The patient is to follow up with Dr. Pete Glatter one week after his     discharge from rehab.  2. The patient is to follow up with neurology, Dr. Pearlean Brownie, in two months.                                                Kela Millin, M.D.    ACV/MEDQ  D:  08/07/2003  T:  08/09/2003  Job:  [id number]   cc:   Hal T. Stoneking, M.D.  [address] [place], [place] [address]  Fax: [phone]   Pramod P. Pearlean Brownie, MD  Fax: [phone]

## 2011-02-23 LAB — POCT I-STAT, CHEM 8
BUN: 40 — ABNORMAL HIGH
Calcium, Ion: 1.19
Glucose, Bld: 155 — ABNORMAL HIGH
HCT: 36 — ABNORMAL LOW
TCO2: 28

## 2011-02-23 LAB — CBC
Hemoglobin: 12.4 — ABNORMAL LOW
MCV: 92.4
RBC: 4.01 — ABNORMAL LOW
WBC: 11.4 — ABNORMAL HIGH

## 2011-02-23 LAB — POCT CARDIAC MARKERS
CKMB, poc: <1 — ABNORMAL LOW
Myoglobin, poc: 128

## 2011-02-23 LAB — URINALYSIS, ROUTINE W REFLEX MICROSCOPIC
Bilirubin Urine: NEGATIVE
Glucose, UA: NEGATIVE
Ketones, ur: NEGATIVE
Nitrite: NEGATIVE
Protein, ur: NEGATIVE
Urobilinogen, UA: 0.2

## 2011-02-23 LAB — DIFFERENTIAL
Lymphs Abs: 2.1
Monocytes Relative: 7
Neutro Abs: 8.4 — ABNORMAL HIGH
Neutrophils Relative %: 73

## 2011-02-23 LAB — OCCULT BLOOD X 1 CARD TO LAB, STOOL: Fecal Occult Bld: NEGATIVE

## 2011-02-24 LAB — BASIC METABOLIC PANEL
Calcium: 9
Creatinine, Ser: 1.08
GFR calc Af Amer: >60
GFR calc non Af Amer: >60

## 2011-02-24 LAB — CBC
RBC: 3.52 — ABNORMAL LOW
WBC: 6.1

## 2011-02-25 LAB — BASIC METABOLIC PANEL
CO2: 30
Calcium: 9
Chloride: 106
GFR calc Af Amer: >60
Sodium: 142

## 2011-02-25 LAB — CBC
Hemoglobin: 11.2 — ABNORMAL LOW
MCHC: 34.3
RBC: 3.56 — ABNORMAL LOW

## 2011-06-03 DIAGNOSIS — I10 Essential (primary) hypertension: Secondary | ICD-10-CM | POA: Diagnosis not present

## 2011-06-03 DIAGNOSIS — Z0000 Encounter for general adult medical examination without abnormal findings: Secondary | ICD-10-CM | POA: Diagnosis not present

## 2011-06-03 DIAGNOSIS — Z79899 Other long term (current) drug therapy: Secondary | ICD-10-CM | POA: Diagnosis not present

## 2011-06-15 DIAGNOSIS — C44711 Basal cell carcinoma of skin of unspecified lower limb, including hip: Secondary | ICD-10-CM | POA: Diagnosis not present

## 2011-07-23 DIAGNOSIS — C61 Malignant neoplasm of prostate: Secondary | ICD-10-CM | POA: Diagnosis not present

## 2011-07-30 DIAGNOSIS — R3915 Urgency of urination: Secondary | ICD-10-CM | POA: Diagnosis not present

## 2011-07-30 DIAGNOSIS — C61 Malignant neoplasm of prostate: Secondary | ICD-10-CM | POA: Diagnosis not present

## 2011-08-02 ENCOUNTER — Other Ambulatory Visit: Payer: Self-pay | Admitting: Urology

## 2011-08-02 DIAGNOSIS — C61 Malignant neoplasm of prostate: Secondary | ICD-10-CM

## 2011-08-06 DIAGNOSIS — H903 Sensorineural hearing loss, bilateral: Secondary | ICD-10-CM | POA: Diagnosis not present

## 2011-08-10 ENCOUNTER — Ambulatory Visit (HOSPITAL_COMMUNITY): Payer: PRIVATE HEALTH INSURANCE

## 2011-08-10 ENCOUNTER — Encounter (HOSPITAL_COMMUNITY)
Admission: RE | Admit: 2011-08-10 | Discharge: 2011-08-10 | Disposition: A | Discharge status: Home / Self Care | Payer: Medicare Other | Source: Ambulatory Visit | Attending: Urology | Admitting: Urology

## 2011-08-10 DIAGNOSIS — M79609 Pain in unspecified limb: Secondary | ICD-10-CM | POA: Diagnosis not present

## 2011-08-10 DIAGNOSIS — Z8546 Personal history of malignant neoplasm of prostate: Secondary | ICD-10-CM | POA: Diagnosis not present

## 2011-08-10 DIAGNOSIS — C61 Malignant neoplasm of prostate: Secondary | ICD-10-CM | POA: Diagnosis not present

## 2011-08-10 DIAGNOSIS — R972 Elevated prostate specific antigen [PSA]: Secondary | ICD-10-CM | POA: Insufficient documentation

## 2011-08-10 DIAGNOSIS — M1990 Unspecified osteoarthritis, unspecified site: Secondary | ICD-10-CM | POA: Diagnosis not present

## 2011-08-10 MED ORDER — TECHNETIUM TC 99M MEDRONATE IV KIT
25.0000 | PACK | Freq: Once | INTRAVENOUS | Status: AC | PRN
  Administered 2011-08-10: 25 via INTRAVENOUS

## 2011-08-23 DIAGNOSIS — I701 Atherosclerosis of renal artery: Secondary | ICD-10-CM | POA: Diagnosis not present

## 2011-08-23 DIAGNOSIS — I2510 Atherosclerotic heart disease of native coronary artery without angina pectoris: Secondary | ICD-10-CM | POA: Diagnosis not present

## 2011-08-23 DIAGNOSIS — I10 Essential (primary) hypertension: Secondary | ICD-10-CM | POA: Diagnosis not present

## 2011-08-23 DIAGNOSIS — E7800 Pure hypercholesterolemia, unspecified: Secondary | ICD-10-CM | POA: Diagnosis not present

## 2011-11-22 DIAGNOSIS — C61 Malignant neoplasm of prostate: Secondary | ICD-10-CM | POA: Diagnosis not present

## 2011-12-09 DIAGNOSIS — I10 Essential (primary) hypertension: Secondary | ICD-10-CM | POA: Diagnosis not present

## 2011-12-09 DIAGNOSIS — Z79899 Other long term (current) drug therapy: Secondary | ICD-10-CM | POA: Diagnosis not present

## 2011-12-09 DIAGNOSIS — I6990 Unspecified sequelae of unspecified cerebrovascular disease: Secondary | ICD-10-CM | POA: Diagnosis not present

## 2011-12-09 DIAGNOSIS — E7800 Pure hypercholesterolemia, unspecified: Secondary | ICD-10-CM | POA: Diagnosis not present

## 2012-02-03 DIAGNOSIS — H0230 Blepharochalasis unspecified eye, unspecified eyelid: Secondary | ICD-10-CM | POA: Diagnosis not present

## 2012-02-03 DIAGNOSIS — Z961 Presence of intraocular lens: Secondary | ICD-10-CM | POA: Diagnosis not present

## 2012-02-23 DIAGNOSIS — C61 Malignant neoplasm of prostate: Secondary | ICD-10-CM | POA: Diagnosis not present

## 2012-02-25 ENCOUNTER — Other Ambulatory Visit: Payer: Self-pay | Admitting: Urology

## 2012-02-25 DIAGNOSIS — C61 Malignant neoplasm of prostate: Secondary | ICD-10-CM

## 2012-03-03 DIAGNOSIS — L570 Actinic keratosis: Secondary | ICD-10-CM | POA: Diagnosis not present

## 2012-03-03 DIAGNOSIS — C44711 Basal cell carcinoma of skin of unspecified lower limb, including hip: Secondary | ICD-10-CM | POA: Diagnosis not present

## 2012-03-03 DIAGNOSIS — C44611 Basal cell carcinoma of skin of unspecified upper limb, including shoulder: Secondary | ICD-10-CM | POA: Diagnosis not present

## 2012-03-08 ENCOUNTER — Encounter (HOSPITAL_COMMUNITY)
Admission: RE | Admit: 2012-03-08 | Discharge: 2012-03-08 | Disposition: A | Discharge status: Home / Self Care | Payer: Medicare Other | Source: Ambulatory Visit | Attending: Urology | Admitting: Urology

## 2012-03-08 ENCOUNTER — Encounter (HOSPITAL_COMMUNITY): Payer: Self-pay

## 2012-03-08 DIAGNOSIS — C61 Malignant neoplasm of prostate: Secondary | ICD-10-CM | POA: Insufficient documentation

## 2012-03-08 MED ORDER — TECHNETIUM TC 99M MEDRONATE IV KIT
24.0000 | PACK | Freq: Once | INTRAVENOUS | Status: AC | PRN
  Administered 2012-03-08: 24 via INTRAVENOUS

## 2012-03-08 MED ORDER — FLUDEOXYGLUCOSE F - 18 (FDG) INJECTION: 24.0000 | Freq: Once | INTRAVENOUS | Status: DC | PRN

## 2012-03-16 DIAGNOSIS — Z23 Encounter for immunization: Secondary | ICD-10-CM | POA: Diagnosis not present

## 2012-03-28 DIAGNOSIS — C4491 Basal cell carcinoma of skin, unspecified: Secondary | ICD-10-CM | POA: Diagnosis not present

## 2012-04-24 DIAGNOSIS — I2510 Atherosclerotic heart disease of native coronary artery without angina pectoris: Secondary | ICD-10-CM | POA: Diagnosis not present

## 2012-04-24 DIAGNOSIS — E7800 Pure hypercholesterolemia, unspecified: Secondary | ICD-10-CM | POA: Diagnosis not present

## 2012-04-24 DIAGNOSIS — I701 Atherosclerosis of renal artery: Secondary | ICD-10-CM | POA: Diagnosis not present

## 2012-04-24 DIAGNOSIS — I10 Essential (primary) hypertension: Secondary | ICD-10-CM | POA: Diagnosis not present

## 2012-06-13 DIAGNOSIS — Z1331 Encounter for screening for depression: Secondary | ICD-10-CM | POA: Diagnosis not present

## 2012-06-13 DIAGNOSIS — E7800 Pure hypercholesterolemia, unspecified: Secondary | ICD-10-CM | POA: Diagnosis not present

## 2012-06-13 DIAGNOSIS — Z0000 Encounter for general adult medical examination without abnormal findings: Secondary | ICD-10-CM | POA: Diagnosis not present

## 2012-06-13 DIAGNOSIS — Z79899 Other long term (current) drug therapy: Secondary | ICD-10-CM | POA: Diagnosis not present

## 2012-06-14 DIAGNOSIS — E7800 Pure hypercholesterolemia, unspecified: Secondary | ICD-10-CM | POA: Diagnosis not present

## 2012-06-14 DIAGNOSIS — Z79899 Other long term (current) drug therapy: Secondary | ICD-10-CM | POA: Diagnosis not present

## 2012-06-14 DIAGNOSIS — D539 Nutritional anemia, unspecified: Secondary | ICD-10-CM | POA: Diagnosis not present

## 2012-07-06 DIAGNOSIS — I8310 Varicose veins of unspecified lower extremity with inflammation: Secondary | ICD-10-CM | POA: Diagnosis not present

## 2012-07-06 DIAGNOSIS — Z85828 Personal history of other malignant neoplasm of skin: Secondary | ICD-10-CM | POA: Diagnosis not present

## 2012-07-06 DIAGNOSIS — L821 Other seborrheic keratosis: Secondary | ICD-10-CM | POA: Diagnosis not present

## 2012-07-06 DIAGNOSIS — C44519 Basal cell carcinoma of skin of other part of trunk: Secondary | ICD-10-CM | POA: Diagnosis not present

## 2012-07-06 DIAGNOSIS — D485 Neoplasm of uncertain behavior of skin: Secondary | ICD-10-CM | POA: Diagnosis not present

## 2012-07-06 DIAGNOSIS — L820 Inflamed seborrheic keratosis: Secondary | ICD-10-CM | POA: Diagnosis not present

## 2012-07-06 DIAGNOSIS — L570 Actinic keratosis: Secondary | ICD-10-CM | POA: Diagnosis not present

## 2012-08-14 DIAGNOSIS — L570 Actinic keratosis: Secondary | ICD-10-CM | POA: Diagnosis not present

## 2012-08-21 DIAGNOSIS — C61 Malignant neoplasm of prostate: Secondary | ICD-10-CM | POA: Diagnosis not present

## 2012-09-12 DIAGNOSIS — L570 Actinic keratosis: Secondary | ICD-10-CM | POA: Diagnosis not present

## 2012-09-21 DIAGNOSIS — R197 Diarrhea, unspecified: Secondary | ICD-10-CM | POA: Diagnosis not present

## 2012-09-21 DIAGNOSIS — R5383 Other fatigue: Secondary | ICD-10-CM | POA: Diagnosis not present

## 2012-09-21 DIAGNOSIS — R5381 Other malaise: Secondary | ICD-10-CM | POA: Diagnosis not present

## 2012-10-10 DIAGNOSIS — R109 Unspecified abdominal pain: Secondary | ICD-10-CM | POA: Diagnosis not present

## 2012-10-10 DIAGNOSIS — K5900 Constipation, unspecified: Secondary | ICD-10-CM | POA: Diagnosis not present

## 2012-10-30 DIAGNOSIS — I701 Atherosclerosis of renal artery: Secondary | ICD-10-CM | POA: Diagnosis not present

## 2012-10-30 DIAGNOSIS — E7800 Pure hypercholesterolemia, unspecified: Secondary | ICD-10-CM | POA: Diagnosis not present

## 2012-10-30 DIAGNOSIS — I2510 Atherosclerotic heart disease of native coronary artery without angina pectoris: Secondary | ICD-10-CM | POA: Diagnosis not present

## 2012-10-30 DIAGNOSIS — I10 Essential (primary) hypertension: Secondary | ICD-10-CM | POA: Diagnosis not present

## 2012-11-03 DIAGNOSIS — Z79899 Other long term (current) drug therapy: Secondary | ICD-10-CM | POA: Diagnosis not present

## 2012-11-03 DIAGNOSIS — I10 Essential (primary) hypertension: Secondary | ICD-10-CM | POA: Diagnosis not present

## 2012-11-03 DIAGNOSIS — I6990 Unspecified sequelae of unspecified cerebrovascular disease: Secondary | ICD-10-CM | POA: Diagnosis not present

## 2012-11-03 DIAGNOSIS — E7800 Pure hypercholesterolemia, unspecified: Secondary | ICD-10-CM | POA: Diagnosis not present

## 2012-11-08 DIAGNOSIS — L820 Inflamed seborrheic keratosis: Secondary | ICD-10-CM | POA: Diagnosis not present

## 2012-11-08 DIAGNOSIS — D485 Neoplasm of uncertain behavior of skin: Secondary | ICD-10-CM | POA: Diagnosis not present

## 2012-11-08 DIAGNOSIS — L570 Actinic keratosis: Secondary | ICD-10-CM | POA: Diagnosis not present

## 2012-11-08 DIAGNOSIS — Z85828 Personal history of other malignant neoplasm of skin: Secondary | ICD-10-CM | POA: Diagnosis not present

## 2012-11-13 DIAGNOSIS — C61 Malignant neoplasm of prostate: Secondary | ICD-10-CM | POA: Diagnosis not present

## 2012-11-20 DIAGNOSIS — C61 Malignant neoplasm of prostate: Secondary | ICD-10-CM | POA: Diagnosis not present

## 2013-01-11 ENCOUNTER — Other Ambulatory Visit: Payer: Self-pay | Admitting: Urology

## 2013-01-11 DIAGNOSIS — C61 Malignant neoplasm of prostate: Secondary | ICD-10-CM

## 2013-02-19 ENCOUNTER — Encounter (HOSPITAL_COMMUNITY)
Admission: RE | Admit: 2013-02-19 | Discharge: 2013-02-19 | Disposition: A | Discharge status: Home / Self Care | Payer: Medicare Other | Source: Ambulatory Visit | Attending: Urology | Admitting: Urology

## 2013-02-19 DIAGNOSIS — C61 Malignant neoplasm of prostate: Secondary | ICD-10-CM | POA: Insufficient documentation

## 2013-02-19 MED ORDER — TECHNETIUM TC 99M MEDRONATE IV KIT
25.0000 | PACK | Freq: Once | INTRAVENOUS | Status: AC | PRN
  Administered 2013-02-19: 25 via INTRAVENOUS

## 2013-03-07 DIAGNOSIS — C61 Malignant neoplasm of prostate: Secondary | ICD-10-CM | POA: Diagnosis not present

## 2013-03-07 DIAGNOSIS — R350 Frequency of micturition: Secondary | ICD-10-CM | POA: Diagnosis not present

## 2013-03-20 DIAGNOSIS — Z23 Encounter for immunization: Secondary | ICD-10-CM | POA: Diagnosis not present

## 2013-04-12 DIAGNOSIS — L723 Sebaceous cyst: Secondary | ICD-10-CM | POA: Diagnosis not present

## 2013-04-12 DIAGNOSIS — Z85828 Personal history of other malignant neoplasm of skin: Secondary | ICD-10-CM | POA: Diagnosis not present

## 2013-06-11 DIAGNOSIS — H01139 Eczematous dermatitis of unspecified eye, unspecified eyelid: Secondary | ICD-10-CM | POA: Diagnosis not present

## 2013-06-11 DIAGNOSIS — Z961 Presence of intraocular lens: Secondary | ICD-10-CM | POA: Diagnosis not present

## 2013-06-11 DIAGNOSIS — H04129 Dry eye syndrome of unspecified lacrimal gland: Secondary | ICD-10-CM | POA: Diagnosis not present

## 2013-06-11 DIAGNOSIS — H10029 Other mucopurulent conjunctivitis, unspecified eye: Secondary | ICD-10-CM | POA: Diagnosis not present

## 2013-06-11 DIAGNOSIS — H02839 Dermatochalasis of unspecified eye, unspecified eyelid: Secondary | ICD-10-CM | POA: Diagnosis not present

## 2013-06-15 DIAGNOSIS — Z0000 Encounter for general adult medical examination without abnormal findings: Secondary | ICD-10-CM | POA: Diagnosis not present

## 2013-06-15 DIAGNOSIS — Z1331 Encounter for screening for depression: Secondary | ICD-10-CM | POA: Diagnosis not present

## 2013-06-15 DIAGNOSIS — E7800 Pure hypercholesterolemia, unspecified: Secondary | ICD-10-CM | POA: Diagnosis not present

## 2013-06-15 DIAGNOSIS — I10 Essential (primary) hypertension: Secondary | ICD-10-CM | POA: Diagnosis not present

## 2013-06-15 DIAGNOSIS — Z79899 Other long term (current) drug therapy: Secondary | ICD-10-CM | POA: Diagnosis not present

## 2013-07-06 DIAGNOSIS — C61 Malignant neoplasm of prostate: Secondary | ICD-10-CM | POA: Diagnosis not present

## 2013-07-11 DIAGNOSIS — R350 Frequency of micturition: Secondary | ICD-10-CM | POA: Diagnosis not present

## 2013-07-11 DIAGNOSIS — C61 Malignant neoplasm of prostate: Secondary | ICD-10-CM | POA: Diagnosis not present

## 2013-08-21 ENCOUNTER — Other Ambulatory Visit: Payer: Self-pay | Admitting: Interventional Cardiology

## 2013-09-20 ENCOUNTER — Encounter: Payer: Self-pay | Admitting: *Deleted

## 2013-09-20 ENCOUNTER — Encounter: Payer: Self-pay | Admitting: Interventional Cardiology

## 2013-09-27 DIAGNOSIS — L570 Actinic keratosis: Secondary | ICD-10-CM | POA: Diagnosis not present

## 2013-09-27 DIAGNOSIS — Z85828 Personal history of other malignant neoplasm of skin: Secondary | ICD-10-CM | POA: Diagnosis not present

## 2013-09-27 DIAGNOSIS — L820 Inflamed seborrheic keratosis: Secondary | ICD-10-CM | POA: Diagnosis not present

## 2013-10-03 ENCOUNTER — Other Ambulatory Visit: Payer: Self-pay | Admitting: Urology

## 2013-10-03 DIAGNOSIS — C61 Malignant neoplasm of prostate: Secondary | ICD-10-CM

## 2013-10-29 ENCOUNTER — Ambulatory Visit: Admitting: Interventional Cardiology

## 2013-11-05 ENCOUNTER — Ambulatory Visit (INDEPENDENT_AMBULATORY_CARE_PROVIDER_SITE_OTHER): Payer: Medicare Other | Admitting: Interventional Cardiology

## 2013-11-05 ENCOUNTER — Encounter: Payer: Self-pay | Admitting: Interventional Cardiology

## 2013-11-05 ENCOUNTER — Encounter: Payer: Self-pay | Admitting: Cardiology

## 2013-11-05 VITALS — BP 152/75 | HR 72 | Ht 67.5 in | Wt 189.1 lb

## 2013-11-05 DIAGNOSIS — I701 Atherosclerosis of renal artery: Secondary | ICD-10-CM | POA: Insufficient documentation

## 2013-11-05 DIAGNOSIS — E7800 Pure hypercholesterolemia, unspecified: Secondary | ICD-10-CM | POA: Insufficient documentation

## 2013-11-05 DIAGNOSIS — I2510 Atherosclerotic heart disease of native coronary artery without angina pectoris: Secondary | ICD-10-CM | POA: Insufficient documentation

## 2013-11-05 DIAGNOSIS — I10 Essential (primary) hypertension: Secondary | ICD-10-CM

## 2013-11-05 NOTE — Patient Instructions (Signed)
Your physician recommends that you continue on your current medications as directed. Please refer to the Current Medication list given to you today.  Your physician wants you to follow-up in: 1 year with Dr. Varanasi. You will receive a reminder letter in the mail two months in advance. If you don't receive a letter, please call our office to schedule the follow-up appointment.  
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
## 2013-11-08 ENCOUNTER — Encounter (HOSPITAL_COMMUNITY)
Admission: RE | Admit: 2013-11-08 | Discharge: 2013-11-08 | Disposition: A | Discharge status: Home / Self Care | Payer: Medicare Other | Source: Ambulatory Visit | Attending: Urology | Admitting: Urology

## 2013-11-08 DIAGNOSIS — R972 Elevated prostate specific antigen [PSA]: Secondary | ICD-10-CM | POA: Insufficient documentation

## 2013-11-08 DIAGNOSIS — C61 Malignant neoplasm of prostate: Secondary | ICD-10-CM | POA: Diagnosis not present

## 2013-11-08 MED ORDER — TECHNETIUM TC 99M MEDRONATE IV KIT
25.6000 | PACK | Freq: Once | INTRAVENOUS | Status: AC | PRN
  Administered 2013-11-08: 25.6 via INTRAVENOUS

## 2013-12-11 DIAGNOSIS — Z23 Encounter for immunization: Secondary | ICD-10-CM | POA: Diagnosis not present

## 2013-12-11 DIAGNOSIS — E7800 Pure hypercholesterolemia, unspecified: Secondary | ICD-10-CM | POA: Diagnosis not present

## 2013-12-11 DIAGNOSIS — I10 Essential (primary) hypertension: Secondary | ICD-10-CM | POA: Diagnosis not present

## 2013-12-11 DIAGNOSIS — Z79899 Other long term (current) drug therapy: Secondary | ICD-10-CM | POA: Diagnosis not present

## 2013-12-12 DIAGNOSIS — I8310 Varicose veins of unspecified lower extremity with inflammation: Secondary | ICD-10-CM | POA: Diagnosis not present

## 2013-12-12 DIAGNOSIS — B351 Tinea unguium: Secondary | ICD-10-CM | POA: Diagnosis not present

## 2013-12-12 DIAGNOSIS — D239 Other benign neoplasm of skin, unspecified: Secondary | ICD-10-CM | POA: Diagnosis not present

## 2013-12-12 DIAGNOSIS — L821 Other seborrheic keratosis: Secondary | ICD-10-CM | POA: Diagnosis not present

## 2013-12-12 DIAGNOSIS — L819 Disorder of pigmentation, unspecified: Secondary | ICD-10-CM | POA: Diagnosis not present

## 2013-12-12 DIAGNOSIS — Z85828 Personal history of other malignant neoplasm of skin: Secondary | ICD-10-CM | POA: Diagnosis not present

## 2013-12-19 DIAGNOSIS — C61 Malignant neoplasm of prostate: Secondary | ICD-10-CM | POA: Diagnosis not present

## 2013-12-26 DIAGNOSIS — C61 Malignant neoplasm of prostate: Secondary | ICD-10-CM | POA: Diagnosis not present

## 2013-12-26 DIAGNOSIS — C7951 Secondary malignant neoplasm of bone: Secondary | ICD-10-CM | POA: Diagnosis not present

## 2014-01-11 ENCOUNTER — Other Ambulatory Visit: Payer: Self-pay | Admitting: Interventional Cardiology

## 2014-02-07 DIAGNOSIS — Z961 Presence of intraocular lens: Secondary | ICD-10-CM | POA: Diagnosis not present

## 2014-02-07 DIAGNOSIS — H02839 Dermatochalasis of unspecified eye, unspecified eyelid: Secondary | ICD-10-CM | POA: Diagnosis not present

## 2014-02-07 DIAGNOSIS — H04129 Dry eye syndrome of unspecified lacrimal gland: Secondary | ICD-10-CM | POA: Diagnosis not present

## 2014-02-12 DIAGNOSIS — Z23 Encounter for immunization: Secondary | ICD-10-CM | POA: Diagnosis not present

## 2014-02-12 DIAGNOSIS — I6990 Unspecified sequelae of unspecified cerebrovascular disease: Secondary | ICD-10-CM | POA: Diagnosis not present

## 2014-02-12 DIAGNOSIS — I10 Essential (primary) hypertension: Secondary | ICD-10-CM | POA: Diagnosis not present

## 2014-03-07 ENCOUNTER — Other Ambulatory Visit: Payer: Self-pay | Admitting: Urology

## 2014-03-07 DIAGNOSIS — C7951 Secondary malignant neoplasm of bone: Secondary | ICD-10-CM

## 2014-03-24 ENCOUNTER — Other Ambulatory Visit: Payer: Self-pay | Admitting: Interventional Cardiology

## 2014-04-03 ENCOUNTER — Encounter (HOSPITAL_COMMUNITY): Payer: Medicare Other

## 2014-04-03 ENCOUNTER — Encounter (HOSPITAL_COMMUNITY)
Admission: RE | Admit: 2014-04-03 | Discharge: 2014-04-03 | Disposition: A | Discharge status: Home / Self Care | Payer: Medicare Other | Source: Ambulatory Visit | Attending: Diagnostic Radiology | Admitting: Diagnostic Radiology

## 2014-04-03 DIAGNOSIS — C7951 Secondary malignant neoplasm of bone: Secondary | ICD-10-CM | POA: Diagnosis not present

## 2014-04-03 DIAGNOSIS — C61 Malignant neoplasm of prostate: Secondary | ICD-10-CM | POA: Diagnosis not present

## 2014-04-03 DIAGNOSIS — M1710 Unilateral primary osteoarthritis, unspecified knee: Secondary | ICD-10-CM | POA: Diagnosis not present

## 2014-04-03 DIAGNOSIS — M899 Disorder of bone, unspecified: Secondary | ICD-10-CM | POA: Diagnosis not present

## 2014-04-03 MED ORDER — TECHNETIUM TC 99M MEDRONATE IV KIT
26.1000 | PACK | Freq: Once | INTRAVENOUS | Status: AC | PRN
  Administered 2014-04-03: 26.1 via INTRAVENOUS

## 2014-04-08 DIAGNOSIS — C7951 Secondary malignant neoplasm of bone: Secondary | ICD-10-CM | POA: Diagnosis not present

## 2014-04-08 DIAGNOSIS — C61 Malignant neoplasm of prostate: Secondary | ICD-10-CM | POA: Diagnosis not present

## 2014-04-11 DIAGNOSIS — C61 Malignant neoplasm of prostate: Secondary | ICD-10-CM | POA: Diagnosis not present

## 2014-04-11 DIAGNOSIS — N2889 Other specified disorders of kidney and ureter: Secondary | ICD-10-CM | POA: Diagnosis not present

## 2014-05-03 DIAGNOSIS — C7951 Secondary malignant neoplasm of bone: Secondary | ICD-10-CM | POA: Diagnosis not present

## 2014-05-03 DIAGNOSIS — C61 Malignant neoplasm of prostate: Secondary | ICD-10-CM | POA: Diagnosis not present

## 2014-06-03 DIAGNOSIS — C7951 Secondary malignant neoplasm of bone: Secondary | ICD-10-CM | POA: Diagnosis not present

## 2014-06-03 DIAGNOSIS — C61 Malignant neoplasm of prostate: Secondary | ICD-10-CM | POA: Diagnosis not present

## 2014-09-19 DIAGNOSIS — L820 Inflamed seborrheic keratosis: Secondary | ICD-10-CM | POA: Diagnosis not present

## 2014-09-19 DIAGNOSIS — Z85828 Personal history of other malignant neoplasm of skin: Secondary | ICD-10-CM | POA: Diagnosis not present

## 2014-11-05 ENCOUNTER — Ambulatory Visit: Payer: Medicare Other | Admitting: Interventional Cardiology

## 2014-11-05 DIAGNOSIS — Z85828 Personal history of other malignant neoplasm of skin: Secondary | ICD-10-CM | POA: Diagnosis not present

## 2014-11-05 DIAGNOSIS — C44622 Squamous cell carcinoma of skin of right upper limb, including shoulder: Secondary | ICD-10-CM | POA: Diagnosis not present

## 2014-11-05 DIAGNOSIS — D485 Neoplasm of uncertain behavior of skin: Secondary | ICD-10-CM | POA: Diagnosis not present

## 2014-11-14 ENCOUNTER — Other Ambulatory Visit: Payer: Self-pay | Admitting: Interventional Cardiology

## 2014-11-18 DIAGNOSIS — C7951 Secondary malignant neoplasm of bone: Secondary | ICD-10-CM | POA: Diagnosis not present

## 2014-11-18 DIAGNOSIS — R350 Frequency of micturition: Secondary | ICD-10-CM | POA: Diagnosis not present

## 2014-11-18 DIAGNOSIS — C61 Malignant neoplasm of prostate: Secondary | ICD-10-CM | POA: Diagnosis not present

## 2014-11-25 DIAGNOSIS — Z8546 Personal history of malignant neoplasm of prostate: Secondary | ICD-10-CM | POA: Diagnosis not present

## 2014-11-25 DIAGNOSIS — C61 Malignant neoplasm of prostate: Secondary | ICD-10-CM | POA: Diagnosis not present

## 2014-11-25 DIAGNOSIS — N323 Diverticulum of bladder: Secondary | ICD-10-CM | POA: Diagnosis not present

## 2014-11-25 DIAGNOSIS — K4010 Bilateral inguinal hernia, with gangrene, not specified as recurrent: Secondary | ICD-10-CM | POA: Diagnosis not present

## 2014-12-24 DIAGNOSIS — C61 Malignant neoplasm of prostate: Secondary | ICD-10-CM | POA: Diagnosis not present

## 2014-12-24 DIAGNOSIS — C7951 Secondary malignant neoplasm of bone: Secondary | ICD-10-CM | POA: Diagnosis not present

## 2015-01-10 ENCOUNTER — Ambulatory Visit (INDEPENDENT_AMBULATORY_CARE_PROVIDER_SITE_OTHER): Payer: Medicare Other | Admitting: Interventional Cardiology

## 2015-01-10 ENCOUNTER — Encounter: Payer: Self-pay | Admitting: Interventional Cardiology

## 2015-01-10 VITALS — BP 130/70 | HR 78 | Ht 67.0 in | Wt 190.8 lb

## 2015-01-10 DIAGNOSIS — I701 Atherosclerosis of renal artery: Secondary | ICD-10-CM

## 2015-01-10 DIAGNOSIS — I10 Essential (primary) hypertension: Secondary | ICD-10-CM | POA: Diagnosis not present

## 2015-01-10 DIAGNOSIS — I2510 Atherosclerotic heart disease of native coronary artery without angina pectoris: Secondary | ICD-10-CM | POA: Diagnosis not present

## 2015-01-10 NOTE — Patient Instructions (Addendum)
Medication Instructions:  Same-no change  Labwork: None  Testing/Procedures: None  Follow-Up: Your physician recommends that you schedule a follow-up appointment in: as needed      

## 2015-01-10 NOTE — Progress Notes (Deleted)
   Patient ID: [name], male    DOB: 1921-12-18, 81 y.o.   MRN: [id number]  HPI    Review of Systems    Physical Exam
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
## 2015-01-13 ENCOUNTER — Encounter: Payer: Self-pay | Admitting: Interventional Cardiology

## 2015-01-14 NOTE — Addendum Note (Signed)
Addended by: Freada Bergeron on: 01/14/2015 03:30 PM   Modules accepted: Orders

## 2015-01-28 DIAGNOSIS — C61 Malignant neoplasm of prostate: Secondary | ICD-10-CM | POA: Diagnosis not present

## 2015-01-28 DIAGNOSIS — C7951 Secondary malignant neoplasm of bone: Secondary | ICD-10-CM | POA: Diagnosis not present

## 2015-02-04 DIAGNOSIS — Z1389 Encounter for screening for other disorder: Secondary | ICD-10-CM | POA: Diagnosis not present

## 2015-02-04 DIAGNOSIS — Z0000 Encounter for general adult medical examination without abnormal findings: Secondary | ICD-10-CM | POA: Diagnosis not present

## 2015-02-04 DIAGNOSIS — C61 Malignant neoplasm of prostate: Secondary | ICD-10-CM | POA: Diagnosis not present

## 2015-02-04 DIAGNOSIS — I10 Essential (primary) hypertension: Secondary | ICD-10-CM | POA: Diagnosis not present

## 2015-02-04 DIAGNOSIS — E780 Pure hypercholesterolemia: Secondary | ICD-10-CM | POA: Diagnosis not present

## 2015-02-04 DIAGNOSIS — I69354 Hemiplegia and hemiparesis following cerebral infarction affecting left non-dominant side: Secondary | ICD-10-CM | POA: Diagnosis not present

## 2015-02-04 DIAGNOSIS — Z79899 Other long term (current) drug therapy: Secondary | ICD-10-CM | POA: Diagnosis not present

## 2015-02-04 DIAGNOSIS — Z7189 Other specified counseling: Secondary | ICD-10-CM | POA: Diagnosis not present

## 2015-02-10 DIAGNOSIS — I10 Essential (primary) hypertension: Secondary | ICD-10-CM | POA: Diagnosis not present

## 2015-02-11 ENCOUNTER — Other Ambulatory Visit: Payer: Self-pay | Admitting: Interventional Cardiology

## 2015-02-19 DIAGNOSIS — L820 Inflamed seborrheic keratosis: Secondary | ICD-10-CM | POA: Diagnosis not present

## 2015-02-19 DIAGNOSIS — C44319 Basal cell carcinoma of skin of other parts of face: Secondary | ICD-10-CM | POA: Diagnosis not present

## 2015-02-19 DIAGNOSIS — L720 Epidermal cyst: Secondary | ICD-10-CM | POA: Diagnosis not present

## 2015-02-19 DIAGNOSIS — L821 Other seborrheic keratosis: Secondary | ICD-10-CM | POA: Diagnosis not present

## 2015-02-19 DIAGNOSIS — C44311 Basal cell carcinoma of skin of nose: Secondary | ICD-10-CM | POA: Diagnosis not present

## 2015-02-19 DIAGNOSIS — L570 Actinic keratosis: Secondary | ICD-10-CM | POA: Diagnosis not present

## 2015-02-19 DIAGNOSIS — D1801 Hemangioma of skin and subcutaneous tissue: Secondary | ICD-10-CM | POA: Diagnosis not present

## 2015-02-19 DIAGNOSIS — D485 Neoplasm of uncertain behavior of skin: Secondary | ICD-10-CM | POA: Diagnosis not present

## 2015-02-19 DIAGNOSIS — Z85828 Personal history of other malignant neoplasm of skin: Secondary | ICD-10-CM | POA: Diagnosis not present

## 2015-02-19 DIAGNOSIS — D225 Melanocytic nevi of trunk: Secondary | ICD-10-CM | POA: Diagnosis not present

## 2015-02-19 DIAGNOSIS — C44619 Basal cell carcinoma of skin of left upper limb, including shoulder: Secondary | ICD-10-CM | POA: Diagnosis not present

## 2015-02-26 DIAGNOSIS — Z85828 Personal history of other malignant neoplasm of skin: Secondary | ICD-10-CM | POA: Diagnosis not present

## 2015-02-26 DIAGNOSIS — C44319 Basal cell carcinoma of skin of other parts of face: Secondary | ICD-10-CM | POA: Diagnosis not present

## 2015-03-04 DIAGNOSIS — C61 Malignant neoplasm of prostate: Secondary | ICD-10-CM | POA: Diagnosis not present

## 2015-03-04 DIAGNOSIS — C7951 Secondary malignant neoplasm of bone: Secondary | ICD-10-CM | POA: Diagnosis not present

## 2015-03-05 DIAGNOSIS — Z4802 Encounter for removal of sutures: Secondary | ICD-10-CM | POA: Diagnosis not present

## 2015-03-25 DIAGNOSIS — Z23 Encounter for immunization: Secondary | ICD-10-CM | POA: Diagnosis not present

## 2015-03-25 DIAGNOSIS — I10 Essential (primary) hypertension: Secondary | ICD-10-CM | POA: Diagnosis not present

## 2015-03-25 DIAGNOSIS — S30811A Abrasion of abdominal wall, initial encounter: Secondary | ICD-10-CM | POA: Diagnosis not present

## 2015-04-04 DIAGNOSIS — C61 Malignant neoplasm of prostate: Secondary | ICD-10-CM | POA: Diagnosis not present

## 2015-04-04 DIAGNOSIS — C7951 Secondary malignant neoplasm of bone: Secondary | ICD-10-CM | POA: Diagnosis not present

## 2015-05-07 DIAGNOSIS — C61 Malignant neoplasm of prostate: Secondary | ICD-10-CM | POA: Diagnosis not present

## 2015-05-07 DIAGNOSIS — C7951 Secondary malignant neoplasm of bone: Secondary | ICD-10-CM | POA: Diagnosis not present

## 2015-06-17 DIAGNOSIS — C61 Malignant neoplasm of prostate: Secondary | ICD-10-CM | POA: Diagnosis not present

## 2015-06-17 DIAGNOSIS — Z0000 Encounter for general adult medical examination without abnormal findings: Secondary | ICD-10-CM | POA: Diagnosis not present

## 2015-06-17 DIAGNOSIS — C7951 Secondary malignant neoplasm of bone: Secondary | ICD-10-CM | POA: Diagnosis not present

## 2015-06-27 DIAGNOSIS — C44629 Squamous cell carcinoma of skin of left upper limb, including shoulder: Secondary | ICD-10-CM | POA: Diagnosis not present

## 2015-06-27 DIAGNOSIS — L570 Actinic keratosis: Secondary | ICD-10-CM | POA: Diagnosis not present

## 2015-06-27 DIAGNOSIS — Z85828 Personal history of other malignant neoplasm of skin: Secondary | ICD-10-CM | POA: Diagnosis not present

## 2015-06-27 DIAGNOSIS — D485 Neoplasm of uncertain behavior of skin: Secondary | ICD-10-CM | POA: Diagnosis not present

## 2015-06-27 DIAGNOSIS — L820 Inflamed seborrheic keratosis: Secondary | ICD-10-CM | POA: Diagnosis not present

## 2015-07-23 DIAGNOSIS — C7951 Secondary malignant neoplasm of bone: Secondary | ICD-10-CM | POA: Diagnosis not present

## 2015-07-23 DIAGNOSIS — C61 Malignant neoplasm of prostate: Secondary | ICD-10-CM | POA: Diagnosis not present

## 2015-08-04 DIAGNOSIS — I6930 Unspecified sequelae of cerebral infarction: Secondary | ICD-10-CM | POA: Diagnosis not present

## 2015-08-04 DIAGNOSIS — I10 Essential (primary) hypertension: Secondary | ICD-10-CM | POA: Diagnosis not present

## 2015-08-04 DIAGNOSIS — R5383 Other fatigue: Secondary | ICD-10-CM | POA: Diagnosis not present

## 2015-08-04 DIAGNOSIS — Z79899 Other long term (current) drug therapy: Secondary | ICD-10-CM | POA: Diagnosis not present

## 2015-08-06 DIAGNOSIS — C7951 Secondary malignant neoplasm of bone: Secondary | ICD-10-CM | POA: Diagnosis not present

## 2015-08-06 DIAGNOSIS — R591 Generalized enlarged lymph nodes: Secondary | ICD-10-CM | POA: Diagnosis not present

## 2015-08-06 DIAGNOSIS — C61 Malignant neoplasm of prostate: Secondary | ICD-10-CM | POA: Diagnosis not present

## 2015-10-16 ENCOUNTER — Other Ambulatory Visit: Payer: Self-pay | Admitting: Urology

## 2015-10-16 DIAGNOSIS — C61 Malignant neoplasm of prostate: Secondary | ICD-10-CM

## 2015-10-21 DIAGNOSIS — C7951 Secondary malignant neoplasm of bone: Secondary | ICD-10-CM | POA: Diagnosis not present

## 2015-10-21 DIAGNOSIS — C61 Malignant neoplasm of prostate: Secondary | ICD-10-CM | POA: Diagnosis not present

## 2015-11-13 DIAGNOSIS — Z85828 Personal history of other malignant neoplasm of skin: Secondary | ICD-10-CM | POA: Diagnosis not present

## 2015-11-13 DIAGNOSIS — C44519 Basal cell carcinoma of skin of other part of trunk: Secondary | ICD-10-CM | POA: Diagnosis not present

## 2015-11-13 DIAGNOSIS — L570 Actinic keratosis: Secondary | ICD-10-CM | POA: Diagnosis not present

## 2015-11-13 DIAGNOSIS — D225 Melanocytic nevi of trunk: Secondary | ICD-10-CM | POA: Diagnosis not present

## 2015-11-13 DIAGNOSIS — D485 Neoplasm of uncertain behavior of skin: Secondary | ICD-10-CM | POA: Diagnosis not present

## 2015-11-18 DIAGNOSIS — C7951 Secondary malignant neoplasm of bone: Secondary | ICD-10-CM | POA: Diagnosis not present

## 2015-11-18 DIAGNOSIS — C61 Malignant neoplasm of prostate: Secondary | ICD-10-CM | POA: Diagnosis not present

## 2015-12-04 ENCOUNTER — Encounter (HOSPITAL_COMMUNITY)
Admission: RE | Admit: 2015-12-04 | Discharge: 2015-12-04 | Disposition: A | Discharge status: Home / Self Care | Payer: Medicare Other | Source: Ambulatory Visit | Attending: Urology | Admitting: Urology

## 2015-12-04 DIAGNOSIS — R948 Abnormal results of function studies of other organs and systems: Secondary | ICD-10-CM | POA: Diagnosis not present

## 2015-12-04 DIAGNOSIS — C61 Malignant neoplasm of prostate: Secondary | ICD-10-CM | POA: Insufficient documentation

## 2015-12-04 MED ORDER — TECHNETIUM TC 99M MEDRONATE IV KIT
25.0000 | PACK | Freq: Once | INTRAVENOUS | Status: AC | PRN
  Administered 2015-12-04: 25 via INTRAVENOUS

## 2015-12-15 DIAGNOSIS — C7951 Secondary malignant neoplasm of bone: Secondary | ICD-10-CM | POA: Diagnosis not present

## 2015-12-15 DIAGNOSIS — C61 Malignant neoplasm of prostate: Secondary | ICD-10-CM | POA: Diagnosis not present

## 2015-12-15 DIAGNOSIS — N1330 Unspecified hydronephrosis: Secondary | ICD-10-CM | POA: Diagnosis not present

## 2015-12-16 ENCOUNTER — Other Ambulatory Visit: Payer: Self-pay | Admitting: Urology

## 2015-12-17 ENCOUNTER — Encounter (HOSPITAL_BASED_OUTPATIENT_CLINIC_OR_DEPARTMENT_OTHER): Payer: Self-pay | Admitting: *Deleted

## 2015-12-17 NOTE — Progress Notes (Signed)
SPOKE W/ PT AND HIS WIFE.  PT [facility] VIA PHONE.  NPO AFTER MN.  ARRIVE AT 0830. NEEDS ISTAT 8.  CURRENT EKG IN CHART AND EPIC.  WILL TAKE NORVASC, LIPITOR, AND ZETIA AM DOS W/ SIPS OF WATER.

## 2015-12-22 ENCOUNTER — Encounter (HOSPITAL_BASED_OUTPATIENT_CLINIC_OR_DEPARTMENT_OTHER)
Admission: RE | Disposition: A | Discharge status: Home / Self Care | Payer: Self-pay | Source: Ambulatory Visit | Attending: Urology

## 2015-12-22 ENCOUNTER — Encounter (HOSPITAL_BASED_OUTPATIENT_CLINIC_OR_DEPARTMENT_OTHER): Payer: Self-pay | Admitting: Anesthesiology

## 2015-12-22 ENCOUNTER — Ambulatory Visit (HOSPITAL_BASED_OUTPATIENT_CLINIC_OR_DEPARTMENT_OTHER)
Admission: RE | Admit: 2015-12-22 | Discharge: 2015-12-22 | Disposition: A | Discharge status: Home / Self Care | Payer: Medicare Other | Source: Ambulatory Visit | Attending: Urology | Admitting: Urology

## 2015-12-22 ENCOUNTER — Ambulatory Visit (HOSPITAL_BASED_OUTPATIENT_CLINIC_OR_DEPARTMENT_OTHER): Payer: Medicare Other | Admitting: Anesthesiology

## 2015-12-22 DIAGNOSIS — Z923 Personal history of irradiation: Secondary | ICD-10-CM | POA: Insufficient documentation

## 2015-12-22 DIAGNOSIS — Z86718 Personal history of other venous thrombosis and embolism: Secondary | ICD-10-CM | POA: Diagnosis not present

## 2015-12-22 DIAGNOSIS — C61 Malignant neoplasm of prostate: Secondary | ICD-10-CM | POA: Insufficient documentation

## 2015-12-22 DIAGNOSIS — I6523 Occlusion and stenosis of bilateral carotid arteries: Secondary | ICD-10-CM | POA: Diagnosis not present

## 2015-12-22 DIAGNOSIS — Z7902 Long term (current) use of antithrombotics/antiplatelets: Secondary | ICD-10-CM | POA: Diagnosis not present

## 2015-12-22 DIAGNOSIS — Z955 Presence of coronary angioplasty implant and graft: Secondary | ICD-10-CM | POA: Insufficient documentation

## 2015-12-22 DIAGNOSIS — I739 Peripheral vascular disease, unspecified: Secondary | ICD-10-CM | POA: Diagnosis not present

## 2015-12-22 DIAGNOSIS — C7951 Secondary malignant neoplasm of bone: Secondary | ICD-10-CM | POA: Diagnosis not present

## 2015-12-22 DIAGNOSIS — N1330 Unspecified hydronephrosis: Secondary | ICD-10-CM | POA: Insufficient documentation

## 2015-12-22 DIAGNOSIS — N400 Enlarged prostate without lower urinary tract symptoms: Secondary | ICD-10-CM | POA: Diagnosis not present

## 2015-12-22 DIAGNOSIS — I69354 Hemiplegia and hemiparesis following cerebral infarction affecting left non-dominant side: Secondary | ICD-10-CM | POA: Diagnosis not present

## 2015-12-22 DIAGNOSIS — I2510 Atherosclerotic heart disease of native coronary artery without angina pectoris: Secondary | ICD-10-CM | POA: Diagnosis not present

## 2015-12-22 DIAGNOSIS — Z79899 Other long term (current) drug therapy: Secondary | ICD-10-CM | POA: Diagnosis not present

## 2015-12-22 DIAGNOSIS — Z7982 Long term (current) use of aspirin: Secondary | ICD-10-CM | POA: Insufficient documentation

## 2015-12-22 DIAGNOSIS — E7800 Pure hypercholesterolemia, unspecified: Secondary | ICD-10-CM | POA: Diagnosis not present

## 2015-12-22 HISTORY — DX: Unspecified sequelae of cerebral infarction: I69.30

## 2015-12-22 HISTORY — DX: Personal history of colon polyps, unspecified: Z86.0100

## 2015-12-22 HISTORY — DX: Localized edema: R60.0

## 2015-12-22 HISTORY — DX: Unspecified osteoarthritis, unspecified site: M19.90

## 2015-12-22 HISTORY — DX: Personal history of thrombophlebitis: Z86.72

## 2015-12-22 HISTORY — DX: Unspecified urinary incontinence: R32

## 2015-12-22 HISTORY — DX: Testicular hypofunction: E29.1

## 2015-12-22 HISTORY — DX: Presence of coronary angioplasty implant and graft: Z95.5

## 2015-12-22 HISTORY — DX: Personal history of colonic polyps: Z86.010

## 2015-12-22 HISTORY — DX: Presence of dental prosthetic device (complete) (partial): Z97.2

## 2015-12-22 HISTORY — DX: Complete loss of teeth, unspecified cause, unspecified class: K08.109

## 2015-12-22 HISTORY — DX: Personal history of transient ischemic attack (TIA), and cerebral infarction without residual deficits: Z86.73

## 2015-12-22 HISTORY — DX: Personal history of other (healed) physical injury and trauma: Z87.828

## 2015-12-22 HISTORY — DX: Personal history of diseases of the blood and blood-forming organs and certain disorders involving the immune mechanism: Z86.2

## 2015-12-22 HISTORY — DX: Unspecified hydronephrosis: N13.30

## 2015-12-22 HISTORY — DX: Presence of external hearing-aid: Z97.4

## 2015-12-22 HISTORY — DX: Personal history of other venous thrombosis and embolism: Z86.718

## 2015-12-22 HISTORY — PX: CYSTOSCOPY W/ URETERAL STENT PLACEMENT: SHX1429

## 2015-12-22 HISTORY — DX: Personal history of other diseases of the nervous system and sense organs: Z86.69

## 2015-12-22 HISTORY — DX: Unspecified abnormalities of gait and mobility: R26.9

## 2015-12-22 HISTORY — DX: Benign prostatic hyperplasia without lower urinary tract symptoms: N40.0

## 2015-12-22 LAB — POCT I-STAT, CHEM 8
BUN: 34 mg/dL — ABNORMAL HIGH (ref 6–20)
CALCIUM ION: 1.22 mmol/L (ref 1.12–1.23)
CHLORIDE: 105 mmol/L (ref 101–111)
CREATININE: 1.6 mg/dL — AB (ref 0.61–1.24)
GLUCOSE: 96 mg/dL (ref 65–99)
HCT: 31 % — ABNORMAL LOW (ref 39.0–52.0)
Hemoglobin: 10.5 g/dL — ABNORMAL LOW (ref 13.0–17.0)
POTASSIUM: 3.4 mmol/L — AB (ref 3.5–5.1)
Sodium: 144 mmol/L (ref 135–145)
TCO2: 27 mmol/L (ref 0–100)

## 2015-12-22 SURGERY — CYSTOSCOPY, WITH RETROGRADE PYELOGRAM AND URETERAL STENT INSERTION
Anesthesia: General | Site: Renal | Laterality: Left

## 2015-12-22 MED ORDER — ONDANSETRON HCL 4 MG/2ML IJ SOLN
INTRAMUSCULAR | Status: AC
  Filled 2015-12-22: qty 2

## 2015-12-22 MED ORDER — LIDOCAINE HCL (CARDIAC) 20 MG/ML IV SOLN
INTRAVENOUS | Status: AC
  Filled 2015-12-22: qty 5

## 2015-12-22 MED ORDER — ACETAMINOPHEN 325 MG PO TABS
650.0000 mg | ORAL_TABLET | ORAL | Status: DC | PRN
  Filled 2015-12-22: qty 2

## 2015-12-22 MED ORDER — PROPOFOL 10 MG/ML IV BOLUS
INTRAVENOUS | Status: AC
  Filled 2015-12-22: qty 20

## 2015-12-22 MED ORDER — PROPOFOL 10 MG/ML IV BOLUS
INTRAVENOUS | Status: DC | PRN
  Administered 2015-12-22: 100 mg via INTRAVENOUS

## 2015-12-22 MED ORDER — FENTANYL CITRATE (PF) 100 MCG/2ML IJ SOLN
INTRAMUSCULAR | Status: DC | PRN
Start: 2015-12-22 — End: 2015-12-22
  Administered 2015-12-22: 25 ug via INTRAVENOUS

## 2015-12-22 MED ORDER — PHENYLEPHRINE HCL 10 MG/ML IJ SOLN
INTRAMUSCULAR | Status: DC | PRN
  Administered 2015-12-22 (×2): 80 ug via INTRAVENOUS

## 2015-12-22 MED ORDER — SODIUM CHLORIDE 0.9 % IV SOLN
250.0000 mL | INTRAVENOUS | Status: DC | PRN
  Filled 2015-12-22: qty 250

## 2015-12-22 MED ORDER — DEXAMETHASONE SODIUM PHOSPHATE 4 MG/ML IJ SOLN
INTRAMUSCULAR | Status: DC | PRN
  Administered 2015-12-22: 5 mg via INTRAVENOUS

## 2015-12-22 MED ORDER — ONDANSETRON HCL 4 MG/2ML IJ SOLN
INTRAMUSCULAR | Status: DC | PRN
  Administered 2015-12-22: 4 mg via INTRAVENOUS

## 2015-12-22 MED ORDER — SODIUM CHLORIDE 0.9% FLUSH
3.0000 mL | INTRAVENOUS | Status: DC | PRN
  Filled 2015-12-22: qty 3

## 2015-12-22 MED ORDER — BELLADONNA ALKALOIDS-OPIUM 16.2-60 MG RE SUPP
RECTAL | Status: AC
  Filled 2015-12-22: qty 1

## 2015-12-22 MED ORDER — ARTIFICIAL TEARS OP OINT
TOPICAL_OINTMENT | OPHTHALMIC | Status: AC
  Filled 2015-12-22: qty 3.5

## 2015-12-22 MED ORDER — OXYCODONE HCL 5 MG PO TABS
5.0000 mg | ORAL_TABLET | ORAL | Status: DC | PRN
Start: 2015-12-22 — End: 2015-12-22
  Filled 2015-12-22: qty 2

## 2015-12-22 MED ORDER — SODIUM CHLORIDE 0.9 % IR SOLN
Status: DC | PRN
  Administered 2015-12-22: 1000 mL

## 2015-12-22 MED ORDER — SODIUM CHLORIDE 0.9% FLUSH
3.0000 mL | Freq: Two times a day (BID) | INTRAVENOUS | Status: DC
  Filled 2015-12-22: qty 3

## 2015-12-22 MED ORDER — ONDANSETRON HCL 4 MG/2ML IJ SOLN
4.0000 mg | Freq: Once | INTRAMUSCULAR | Status: DC | PRN
  Filled 2015-12-22: qty 2

## 2015-12-22 MED ORDER — FENTANYL CITRATE (PF) 100 MCG/2ML IJ SOLN
INTRAMUSCULAR | Status: AC
  Filled 2015-12-22: qty 2

## 2015-12-22 MED ORDER — FENTANYL CITRATE (PF) 100 MCG/2ML IJ SOLN
25.0000 ug | INTRAMUSCULAR | Status: DC | PRN
  Filled 2015-12-22: qty 1

## 2015-12-22 MED ORDER — CEFAZOLIN SODIUM-DEXTROSE 2-4 GM/100ML-% IV SOLN
INTRAVENOUS | Status: AC
  Filled 2015-12-22: qty 100

## 2015-12-22 MED ORDER — LACTATED RINGERS IV SOLN
INTRAVENOUS | Status: DC
  Administered 2015-12-22 (×2): via INTRAVENOUS
  Filled 2015-12-22: qty 1000

## 2015-12-22 MED ORDER — ACETAMINOPHEN 650 MG RE SUPP
650.0000 mg | RECTAL | Status: DC | PRN
  Filled 2015-12-22: qty 1

## 2015-12-22 MED ORDER — LIDOCAINE HCL (CARDIAC) 20 MG/ML IV SOLN
INTRAVENOUS | Status: DC | PRN
  Administered 2015-12-22: 60 mg via INTRAVENOUS

## 2015-12-22 MED ORDER — CEFAZOLIN IN D5W 1 GM/50ML IV SOLN
1.0000 g | INTRAVENOUS | Status: DC
  Filled 2015-12-22: qty 50

## 2015-12-22 MED ORDER — DEXAMETHASONE SODIUM PHOSPHATE 10 MG/ML IJ SOLN
INTRAMUSCULAR | Status: AC
  Filled 2015-12-22: qty 1

## 2015-12-22 MED ORDER — IOHEXOL 300 MG/ML  SOLN
INTRAMUSCULAR | Status: DC | PRN
  Administered 2015-12-22: 10 mL

## 2015-12-22 MED ORDER — CEFAZOLIN SODIUM-DEXTROSE 2-4 GM/100ML-% IV SOLN
2.0000 g | INTRAVENOUS | Status: AC
  Administered 2015-12-22: 2 g via INTRAVENOUS
  Filled 2015-12-22: qty 100

## 2015-12-22 MED ORDER — PHENYLEPHRINE 40 MCG/ML (10ML) SYRINGE FOR IV PUSH (FOR BLOOD PRESSURE SUPPORT)
PREFILLED_SYRINGE | INTRAVENOUS | Status: AC
  Filled 2015-12-22: qty 10

## 2015-12-22 SURGICAL SUPPLY — 25 items
ADAPTER CATH URET PLST 4-6FR (CATHETERS) IMPLANT
ADPR CATH URET STRL DISP 4-6FR (CATHETERS)
BAG DRAIN URO-CYSTO SKYTR STRL (DRAIN) ×3 IMPLANT
BAG DRN UROCATH (DRAIN) ×1
CANISTER SUCT LVC 12 LTR MEDI- (MISCELLANEOUS) IMPLANT
CATH INTERMIT  6FR 70CM (CATHETERS) ×2 IMPLANT
CLOTH BEACON ORANGE TIMEOUT ST (SAFETY) ×3 IMPLANT
GLOVE BIO SURGEON STRL SZ 6.5 (GLOVE) ×1 IMPLANT
GLOVE BIO SURGEON STRL SZ8 (GLOVE) ×3 IMPLANT
GLOVE BIO SURGEONS STRL SZ 6.5 (GLOVE) ×1
GLOVE BIOGEL PI IND STRL 6.5 (GLOVE) IMPLANT
GLOVE BIOGEL PI INDICATOR 6.5 (GLOVE) ×4
GOWN STRL REUS W/ TWL XL LVL3 (GOWN DISPOSABLE) ×1 IMPLANT
GOWN STRL REUS W/TWL XL LVL3 (GOWN DISPOSABLE) ×3
GOWN XL W/COTTON TOWEL STD (GOWNS) ×3 IMPLANT
GUIDEWIRE 0.038 PTFE COATED (WIRE) IMPLANT
GUIDEWIRE ANG ZIPWIRE 038X150 (WIRE) IMPLANT
GUIDEWIRE STR DUAL SENSOR (WIRE) IMPLANT
KIT ROOM TURNOVER WOR (KITS) ×3 IMPLANT
MANIFOLD NEPTUNE II (INSTRUMENTS) ×2 IMPLANT
NS IRRIG 500ML POUR BTL (IV SOLUTION) IMPLANT
PACK CYSTO (CUSTOM PROCEDURE TRAY) ×3 IMPLANT
STENT URET 6FRX24 CONTOUR (STENTS) ×2 IMPLANT
TUBE CONNECTING 12'X1/4 (SUCTIONS)
TUBE CONNECTING 12X1/4 (SUCTIONS) IMPLANT

## 2015-12-22 NOTE — Anesthesia Procedure Notes (Signed)
Procedure Name: LMA Insertion Date/Time: 12/22/2015 10:11 AM Performed by: Mechele Claude Pre-anesthesia Checklist: Patient identified, Emergency Drugs available, Suction available and Patient being monitored Patient Re-evaluated:Patient Re-evaluated prior to inductionOxygen Delivery Method: Circle system utilized Preoxygenation: Pre-oxygenation with 100% oxygen Intubation Type: IV induction Ventilation: Mask ventilation without difficulty LMA: LMA inserted LMA Size: 4.0 Number of attempts: 1 Airway Equipment and Method: Bite block Placement Confirmation: positive ETCO2 Tube secured with: Tape Dental Injury: Teeth and Oropharynx as per pre-operative assessment
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
## 2015-12-22 NOTE — Anesthesia Postprocedure Evaluation (Signed)
Anesthesia Post Note  Patient: [name]  Procedure(s) Performed: Procedure(s) (LRB): CYSTOSCOPY WITH RETROGRADE PYELOGRAM/URETERAL STENT PLACEMENT (Left)  Patient location during evaluation: PACU Anesthesia Type: General Level of consciousness: awake and alert Pain management: pain level controlled Vital Signs Assessment: post-procedure vital signs reviewed and stable Respiratory status: spontaneous breathing, nonlabored ventilation, respiratory function stable and patient connected to nasal cannula oxygen Cardiovascular status: blood pressure returned to baseline and stable Postop Assessment: no signs of nausea or vomiting Anesthetic complications: no    Last Vitals:  Vitals:   12/22/15 0900 12/22/15 1040  BP: (!) 147/68 109/60  Pulse: 75 62  Resp: 18   Temp: 37.1 C 36.4 C    Last Pain:  Vitals:   12/22/15 0900  TempSrc: Oral                 Constantine Ruddick JENNETTE
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
## 2015-12-22 NOTE — Transfer of Care (Signed)
   Last Vitals:  Vitals:   12/22/15 0900  BP: (!) 147/68  Pulse: 75  Resp: 18  Temp: 37.1 C    Last Pain:  Vitals:   12/22/15 0900  TempSrc: Oral      Patients Stated Pain Goal: 7 (12/22/15 [id number])  Immediate Anesthesia Transfer of Care Note  Patient: [name]  Procedure(s) Performed: Procedure(s) (LRB): CYSTOSCOPY WITH RETROGRADE PYELOGRAM/URETERAL STENT PLACEMENT (Left)  Patient Location: PACU  Anesthesia Type: General  Level of Consciousness: awake, alert  and oriented  Airway & Oxygen Therapy: Patient Spontanous Breathing and Patient connected to face mask oxygen  Post-op Assessment: Report given to PACU RN and Post -op Vital signs reviewed and stable  Post vital signs: Reviewed and stable  Complications: No apparent anesthesia complications

## 2015-12-22 NOTE — Op Note (Signed)
PATIENT:  [name]  PRE-OPERATIVE DIAGNOSIS: Left hydronephrosis  POST-OPERATIVE DIAGNOSIS: Same  PROCEDURE: Cystoscopy, left retrograde ureteropyelogram, placement of left double-J stent-24 cm x 6 [place] contour, without tether  SURGEON:  Lillette Boxer. Hikeem Andersson, M.D.  ANESTHESIA:  General  EBL:  Minimal  DRAINS: 24 cm x 6 French contour double-J stent, without tether  LOCAL MEDICATIONS USED:  None  SPECIMEN:  None  INDICATION: [name] is a 82 year old male with recurrent/progressive, early castrate resistant prostate cancer. Recent evaluation included CT the abdomen and pelvis performed for routine follow-up purposes. This revealed left hydronephrosis, significant which is new in onset. The patient has no significant symptoms with this. Because of the patient's current status, i.e. him being quite active and minimally symptomatic with his prostate cancer, it was recommended that we be aggressive and perform stent placement. This procedure as well as risks and complications as well as alternatives of nontreatment have been discussed with this patient as well as his family. He presents at this time for cystoscopy, retrograde and double-J stent placement.  Findings: Prostate was nonobstructive. No lesions were seen within the urethra prostatic urethra. Bladder was somewhat capacious, 2+ trabeculations were noted. No bladder lesions were seen. Ureteral orifices were normal in configuration and location. Retrograde ureteropyelogram revealed a tortuous left ureter, with some narrowing of the proximal ureter. There was significant pyelocaliectasis. No filling defects were seen within the ureter or the renal pelvis/calyceal system. It was felt that the obstruction was most likely due to extrinsic cause i.e. adenopathy.  Description of procedure: The patient was properly identified and marked (if applicable) in the holding area. They were then  taken to the operating room and placed on the  table in a supine position. General anesthesia was then administered. Once fully anesthetized the patient was moved to the dorsolithotomy position and the genitalia and perineum were sterilely prepped and draped in standard fashion. An official timeout was then performed.  A 23 French panendoscope was advanced through the urethra and into the bladder. Findings were described above. Retrograde was performed with a 6 [place] open-ended catheter. Omnipaque was used for the retrograde. See findings above for specifics.  Following retrograde, a 0.038 inch sensor-tip guidewire was advanced through the open-ended catheter, through the tortuous left proximal ureter, and with the assistance of advancing the open-ended catheter proximally, the guidewire was advanced into the renal pelvis. The open-ended catheter was advanced over top of this, and once the guidewire was removed, a hydronephrotic drip verified position of the catheter. The guidewire was advanced to the catheter again, the catheter was then removed. A 24 cm x 6 French contour double-J stent was advanced over top of the guidewire, and positioned with the pusher. Following adequate positioning, the contour stent was deployed with proximal end in the upper pole calyceal system and the distal end curled in the bladder. At this point, the bladder was drained, the scope removed, and the procedure terminated.  The patient tolerated procedure well. His awakened and taken to the PACU in stable condition.      PLAN OF CARE: Discharge to home after PACU  PATIENT DISPOSITION:  PACU - hemodynamically stable.

## 2015-12-22 NOTE — Discharge Instructions (Signed)
1. You may see some blood in the urine and may have some burning with urination for 48-72 hours. You also may notice that you have to urinate more frequently or urgently after your procedure which is normal.  °2. You should call should you develop an inability urinate, fever > 101, persistent nausea and vomiting that prevents you from eating or drinking to stay hydrated.  °3. If you have a stent, you will likely urinate more frequently and urgently until the stent is removed and you may experience some discomfort/pain in the lower abdomen and flank especially when urinating. You may take pain medication prescribed to you if needed for pain. You may also intermittently have blood in the urine until the stent is removed. °4. If you have a catheter, you will be taught how to take care of the catheter by the nursing staff prior to discharge from the hospital.  You may periodically feel a strong urge to void with the catheter in place.  This is a bladder spasm and most often can occur when having a bowel movement or moving around. It is typically self-limited and usually will stop after a few minutes.  You may use some Vaseline or Neosporin around the tip of the catheter to reduce friction at the tip of the penis. You may also see some blood in the urine.  A very small amount of blood can make the urine look quite red.  As long as the catheter is draining well, there usually is not a problem.  However, if the catheter is not draining well and is bloody, you should call the office ([phone]) to notify us. ° °CYSTOSCOPY HOME CARE INSTRUCTIONS ° °Activity: °Rest for the remainder of the day.  Do not drive or operate equipment today.  You may resume normal activities in one to two days as instructed by your physician.  ° °Meals: °Drink plenty of liquids and eat light foods such as gelatin or soup this evening.  You may return to a normal meal plan tomorrow. ° °Return to Work: °You may return to work in one to two days or  as instructed by your physician. ° °Special Instructions / Symptoms: °Call your physician if any of these symptoms occur: ° ° -persistent or heavy bleeding ° -bleeding which continues after first few urination ° -large blood clots that are difficult to pass ° -urine stream diminishes or stops completely ° -fever equal to or higher than 101 degrees Farenheit. ° -cloudy urine with a strong, foul odor ° -severe pain ° °Females should always wipe from front to back after elimination.  You may feel some burning pain when you urinate.  This should disappear with time.  Applying moist heat to the lower abdomen or a hot tub bath may help relieve the pain. \ ° °Follow-Up / Date of Return Visit to Your Physician:  Call for an appointment to arrange follow-up. ° °Patient Signature:  ________________________________________________________ ° °Nurse's Signature:  ________________________________________________________ ° °Post Anesthesia Home Care Instructions ° °Activity: °Get plenty of rest for the remainder of the day. A responsible adult should stay with you for 24 hours following the procedure.  °For the next 24 hours, DO NOT: °-Drive a car °-Operate machinery °-Drink alcoholic beverages °-Take any medication unless instructed by your physician °-Make any legal decisions or sign important papers. ° °Meals: °Start with liquid foods such as gelatin or soup. Progress to regular foods as tolerated. Avoid greasy, spicy, heavy foods. If nausea and/or vomiting occur, drink only   clear liquids until the nausea and/or vomiting subsides. Call your physician if vomiting continues. ° °Special Instructions/Symptoms: °Your throat may feel dry or sore from the anesthesia or the breathing tube placed in your throat during surgery. If this causes discomfort, gargle with warm salt water. The discomfort should disappear within 24 hours. ° °If you had a scopolamine patch placed behind your ear for the management of post- operative nausea and/or  vomiting: ° °1. The medication in the patch is effective for 72 hours, after which it should be removed.  Wrap patch in a tissue and discard in the trash. Wash hands thoroughly with soap and water. °2. You may remove the patch earlier than 72 hours if you experience unpleasant side effects which may include dry mouth, dizziness or visual disturbances. °3. Avoid touching the patch. Wash your hands with soap and water after contact with the patch. °  ° °

## 2015-12-23 ENCOUNTER — Encounter (HOSPITAL_BASED_OUTPATIENT_CLINIC_OR_DEPARTMENT_OTHER): Payer: Self-pay | Admitting: Urology

## 2015-12-23 DIAGNOSIS — C7951 Secondary malignant neoplasm of bone: Secondary | ICD-10-CM | POA: Diagnosis not present

## 2015-12-23 DIAGNOSIS — C61 Malignant neoplasm of prostate: Secondary | ICD-10-CM | POA: Diagnosis not present

## 2015-12-29 ENCOUNTER — Ambulatory Visit
Admission: RE | Admit: 2015-12-29 | Discharge: 2015-12-29 | Disposition: A | Discharge status: Home / Self Care | Payer: Medicare Other | Source: Ambulatory Visit | Attending: Radiation Oncology | Admitting: Radiation Oncology

## 2015-12-29 ENCOUNTER — Encounter: Payer: Self-pay | Admitting: Radiation Oncology

## 2015-12-29 VITALS — BP 118/67 | HR 76 | Resp 16 | Ht 67.0 in | Wt 185.0 lb

## 2015-12-29 DIAGNOSIS — C61 Malignant neoplasm of prostate: Secondary | ICD-10-CM | POA: Insufficient documentation

## 2015-12-29 DIAGNOSIS — Z510 Encounter for antineoplastic radiation therapy: Secondary | ICD-10-CM | POA: Insufficient documentation

## 2015-12-29 DIAGNOSIS — C7951 Secondary malignant neoplasm of bone: Secondary | ICD-10-CM | POA: Diagnosis not present

## 2015-12-29 DIAGNOSIS — C775 Secondary and unspecified malignant neoplasm of intrapelvic lymph nodes: Secondary | ICD-10-CM | POA: Diagnosis not present

## 2015-12-29 NOTE — Progress Notes (Signed)
Histology and Location of Primary Cancer: prostatic adenocarcinoma 3+4 diagnosed in January of 2002  Sites of Visceral and Bony Metastatic Disease: left occipital bone, anterior right third rib, posterior right ninth rib, retroperitoneal and pelvic adenopathy  Location(s) of Symptomatic Metastases: new onset lesion at L4 but, other osseous metastatic disease is stable.  Past/Anticipated chemotherapy by medical oncology, if any: started on androgen deprivation therapy May 04, 2014 receiving 240 mg of Firmagon, first Lupron injection was one month later  Pain on a scale of 0-10 is: reports low back pain at the end of the day only "when I am tired." Patient confirms this pain is along the area of L4. Reports right hip feels bruised.   If Spine Met(s), symptoms, if any, include:  Bowel/Bladder retention or incontinence (please describe): had left kidney stent placed on Monday of last week. Reports hematuria has resolved with stent placement. Reports bladder incontinence. Reports a burning sensation just before he voids but, none during or after. Denies any bowel complaints.   Numbness or weakness in extremities (please describe): Reports he is unable to stand for a prolonged period. Reports using cane to ambulate.   Current Decadron regimen, if applicable: No  Ambulatory status? Walker? Wheelchair?: Ambulatory with cane  SAFETY ISSUES:  Prior radiation? Yes; by Danny Lawless 07/14/2001  Pacemaker/ICD? no  Possible current pregnancy? no  Is the patient on methotrexate? no  Current Complaints / other details:  82 year old male. Married for 65 years. AX: coumadin, adhesive tape, sulfa. Recently, had left ureteral stent placed. Referred for discussion of Xofigo. Reports he continues to drive.

## 2015-12-29 NOTE — Progress Notes (Signed)
  Radiation Oncology         (336) [phone] ________________________________  Name: [name] MRN: [id number]  Date: 12/29/2015  DOB: 1921-12-29  Xofigo Treatment Planning Note:  Diagnosis:  Castration resistant prostate cancer with painful bone involvement  Narrative: [name] is a patient who has been diagnosed with castration resistant prostate cancer with painful bone involvement.  His most recent blood counts show that he remains a good candidate to proceed with Ra-223.  The patient is going to receive Xofigo for his treatment.   Radiation Treatment Planning:  The prescribed radiation activity will be 50 kBq per kg per infusions. The plan is to offer a total of 6 IV administrations of this agent, assuming the blood counts are adequate prior to each administration, with each infusion done at 4 week intervals.  This will be done as an IV administration in the nuclear medicine department, with care to undertake all radiation protection precautions as recommended.   ________________________________  Sheral Apley. Tammi Klippel, M.D.

## 2015-12-29 NOTE — Progress Notes (Signed)
  Radiation Oncology         (336) [phone] ________________________________  Name: [name] MRN: [id number]  Date: 12/29/2015  DOB: [birth year]   To whom it may concern:   [name] is a patient with metastatic, castrate resistant prostate cancer with predominantly bone disease.  For details of his clinical history, please see the previously detailed consultation report that can be supplied with this letter.  I have recommended Radium 223 Dichloride treatment for monthly IV infusion over six months.  Based upon the randomized phase III Alsympca trial, we would expect an improvement in the patient's overall survival, improved pain control, decreased narcotic requirements, and decreased probability of skeletal related adverse events moving forward.  Given the data and this patient's situation, I feel that the delivery of Trudi Ida is medical necessity.  Please call if any further information is required to aid in this patient's treatment.    Sincerely yours,  Sheral Apley. Tammi Klippel, M.D.   -----------------------------------------------------------------------------------------------------------  Ref:  Zack Seal, Diannia Ruder, et al., Alpha emitter radium-223 and survival in metastatic prostate cancer. [facility]. 2013 Jul 18;369(3):213-23
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
## 2015-12-29 NOTE — Progress Notes (Signed)
See progress note under physician encounter. 

## 2015-12-31 ENCOUNTER — Other Ambulatory Visit: Payer: Self-pay | Admitting: Radiation Oncology

## 2015-12-31 ENCOUNTER — Telehealth: Payer: Self-pay | Admitting: *Deleted

## 2015-12-31 DIAGNOSIS — C7951 Secondary malignant neoplasm of bone: Principal | ICD-10-CM

## 2015-12-31 DIAGNOSIS — C61 Malignant neoplasm of prostate: Secondary | ICD-10-CM

## 2015-12-31 NOTE — Telephone Encounter (Signed)
CALLED PATIENT TO INFORM OF LAB AND WEIGHT ON 01-08-16 @ 12 PM FOR XOFIGO INJ. AND HIS FIRST [place] INJECTION ON 01-15-16- ARRIVAL TIME - 11:15 AM @ [facility], SPOKE WITH PATIENT AND HE IS AWARE OF THESE APPTS., AND MAILED PATIENT APPT. CARDS.

## 2016-01-03 ENCOUNTER — Emergency Department (HOSPITAL_COMMUNITY)
Admission: EM | Admit: 2016-01-03 | Discharge: 2016-01-03 | Disposition: A | Discharge status: Home / Self Care | Payer: Medicare Other | Attending: Emergency Medicine | Admitting: Emergency Medicine

## 2016-01-03 ENCOUNTER — Encounter (HOSPITAL_COMMUNITY): Payer: Self-pay | Admitting: Emergency Medicine

## 2016-01-03 DIAGNOSIS — I2510 Atherosclerotic heart disease of native coronary artery without angina pectoris: Secondary | ICD-10-CM | POA: Diagnosis not present

## 2016-01-03 DIAGNOSIS — Z7982 Long term (current) use of aspirin: Secondary | ICD-10-CM | POA: Insufficient documentation

## 2016-01-03 DIAGNOSIS — T6394XA Toxic effect of contact with unspecified venomous animal, undetermined, initial encounter: Secondary | ICD-10-CM | POA: Diagnosis not present

## 2016-01-03 DIAGNOSIS — Z85858 Personal history of malignant neoplasm of other endocrine glands: Secondary | ICD-10-CM | POA: Diagnosis not present

## 2016-01-03 DIAGNOSIS — Z955 Presence of coronary angioplasty implant and graft: Secondary | ICD-10-CM | POA: Insufficient documentation

## 2016-01-03 DIAGNOSIS — I10 Essential (primary) hypertension: Secondary | ICD-10-CM | POA: Insufficient documentation

## 2016-01-03 DIAGNOSIS — T7840XA Allergy, unspecified, initial encounter: Secondary | ICD-10-CM | POA: Diagnosis not present

## 2016-01-03 DIAGNOSIS — Z79899 Other long term (current) drug therapy: Secondary | ICD-10-CM | POA: Insufficient documentation

## 2016-01-03 DIAGNOSIS — T782XXA Anaphylactic shock, unspecified, initial encounter: Secondary | ICD-10-CM | POA: Diagnosis not present

## 2016-01-03 DIAGNOSIS — Z8546 Personal history of malignant neoplasm of prostate: Secondary | ICD-10-CM | POA: Insufficient documentation

## 2016-01-03 DIAGNOSIS — T63301A Toxic effect of unspecified spider venom, accidental (unintentional), initial encounter: Secondary | ICD-10-CM | POA: Diagnosis not present

## 2016-01-03 MED ORDER — HYDROXYZINE HCL 10 MG PO TABS
10.0000 mg | ORAL_TABLET | Freq: Once | ORAL | Status: AC
  Administered 2016-01-03: 10 mg via ORAL
  Filled 2016-01-03: qty 1

## 2016-01-03 MED ORDER — DIPHENHYDRAMINE HCL 25 MG PO CAPS: 25.0000 mg | ORAL_CAPSULE | Freq: Four times a day (QID) | ORAL | 0 refills | Status: AC | PRN

## 2016-01-03 MED ORDER — METHYLPREDNISOLONE SODIUM SUCC 125 MG IJ SOLR
125.0000 mg | Freq: Once | INTRAMUSCULAR | Status: AC
  Administered 2016-01-03: 125 mg via INTRAVENOUS
  Filled 2016-01-03: qty 2

## 2016-01-03 MED ORDER — RANITIDINE HCL 150 MG/10ML PO SYRP
150.0000 mg | ORAL_SOLUTION | Freq: Once | ORAL | Status: AC
  Administered 2016-01-03: 150 mg via ORAL
  Filled 2016-01-03: qty 10

## 2016-01-03 MED ORDER — EPINEPHRINE 0.3 MG/0.3ML IJ SOAJ: 0.3000 mg | Freq: Once | INTRAMUSCULAR | 1 refills | Status: AC

## 2016-01-03 MED ORDER — SODIUM CHLORIDE 0.9 % IV SOLN
Freq: Once | INTRAVENOUS | Status: AC
  Administered 2016-01-03: 18:00:00 via INTRAVENOUS

## 2016-01-03 MED ORDER — RANITIDINE HCL 150 MG PO CAPS: 150.0000 mg | ORAL_CAPSULE | Freq: Every day | ORAL | 0 refills | Status: DC

## 2016-01-03 MED ORDER — PREDNISONE 50 MG PO TABS: ORAL_TABLET | ORAL | 0 refills | Status: DC
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
## 2016-01-03 NOTE — ED Triage Notes (Signed)
Pt to ER BIB [facility] from home where patient was stung by multiple yellow jackets while doing yard work. Pt was initally unresponsive with BP 70/30. Pt received 0.3 Epi IM and 50 mg IV benadryl, per EMS pt RR remained unlabored and clear. Pt arrives with hives present however BP 126/58 at this time. EMS denies oral swelling at any point. Alert and oriented on arrival. 3 stingers removed from patient.

## 2016-01-03 NOTE — ED Notes (Signed)
Pt initial O2 sat on RA 87%, pt placed on 2L nasal cannula with improvement.

## 2016-01-03 NOTE — ED Notes (Signed)
Pt taken off of oxygen by Dr.Irick. Continues to c/o itching

## 2016-01-06 DIAGNOSIS — T782XXA Anaphylactic shock, unspecified, initial encounter: Secondary | ICD-10-CM | POA: Diagnosis not present

## 2016-01-06 DIAGNOSIS — W57XXXA Bitten or stung by nonvenomous insect and other nonvenomous arthropods, initial encounter: Secondary | ICD-10-CM | POA: Diagnosis not present

## 2016-01-07 ENCOUNTER — Telehealth: Payer: Self-pay | Admitting: *Deleted

## 2016-01-07 NOTE — Telephone Encounter (Signed)
Called patient to remind of labs and weight for 01/08/16 @ 12, spoke with patient and he is aware of this appt.

## 2016-01-08 ENCOUNTER — Encounter: Payer: Self-pay | Admitting: Radiation Oncology

## 2016-01-08 ENCOUNTER — Ambulatory Visit
Admission: RE | Admit: 2016-01-08 | Discharge: 2016-01-08 | Disposition: A | Discharge status: Home / Self Care | Payer: Medicare Other | Source: Ambulatory Visit | Attending: Radiation Oncology | Admitting: Radiation Oncology

## 2016-01-08 DIAGNOSIS — C61 Malignant neoplasm of prostate: Secondary | ICD-10-CM | POA: Insufficient documentation

## 2016-01-08 LAB — CBC WITH DIFFERENTIAL/PLATELET
BASO%: 0.1 % (ref 0.0–2.0)
BASOS ABS: 0 10*3/uL (ref 0.0–0.1)
EOS ABS: 0.1 10*3/uL (ref 0.0–0.5)
EOS%: 0.9 % (ref 0.0–7.0)
HCT: 34.3 % — ABNORMAL LOW (ref 38.4–49.9)
HEMOGLOBIN: 11.1 g/dL — AB (ref 13.0–17.1)
LYMPH#: 3 10*3/uL (ref 0.9–3.3)
LYMPH%: 34.2 % (ref 14.0–49.0)
MCH: 30.6 pg (ref 27.2–33.4)
MCHC: 32.5 g/dL (ref 32.0–36.0)
MCV: 94.1 fL (ref 79.3–98.0)
MONO#: 0.9 10*3/uL (ref 0.1–0.9)
MONO%: 10.3 % (ref 0.0–14.0)
NEUT#: 4.8 10*3/uL (ref 1.5–6.5)
NEUT%: 54.5 % (ref 39.0–75.0)
Platelets: 169 10*3/uL (ref 140–400)
RBC: 3.64 10*6/uL — ABNORMAL LOW (ref 4.20–5.82)
RDW: 13.4 % (ref 11.0–14.6)
WBC: 8.8 10*3/uL (ref 4.0–10.3)

## 2016-01-13 ENCOUNTER — Telehealth: Payer: Self-pay | Admitting: *Deleted

## 2016-01-13 NOTE — Telephone Encounter (Signed)
CALLED PATIENT TO INFORM THAT XOFIGO INJ. HAS BEEN MOVED TO 01-29-16, SPOKE WITH PATIENT'S WIFE VIRGINIA AND SHE IS AWARE OF THIS CHANGE AND GOOD WITH THIS CHANGE.

## 2016-01-15 ENCOUNTER — Other Ambulatory Visit: Payer: Self-pay | Admitting: Radiation Oncology

## 2016-01-15 ENCOUNTER — Encounter (HOSPITAL_COMMUNITY): Admission: RE | Admit: 2016-01-15 | Payer: Medicare Other | Source: Ambulatory Visit

## 2016-01-15 ENCOUNTER — Telehealth: Payer: Self-pay | Admitting: *Deleted

## 2016-01-15 DIAGNOSIS — C61 Malignant neoplasm of prostate: Secondary | ICD-10-CM

## 2016-01-15 NOTE — Telephone Encounter (Signed)
CALLED PATIENT TO INFORM THAT NEEDS LABS AND WEIGHT ON 01-22-16, LVM FOR A RETURN CALL

## 2016-01-22 ENCOUNTER — Ambulatory Visit
Admission: RE | Admit: 2016-01-22 | Discharge: 2016-01-22 | Disposition: A | Discharge status: Home / Self Care | Payer: Medicare Other | Source: Ambulatory Visit | Attending: Radiation Oncology | Admitting: Radiation Oncology

## 2016-01-22 ENCOUNTER — Encounter: Payer: Self-pay | Admitting: *Deleted

## 2016-01-22 ENCOUNTER — Other Ambulatory Visit: Payer: Self-pay | Admitting: *Deleted

## 2016-01-22 DIAGNOSIS — C61 Malignant neoplasm of prostate: Secondary | ICD-10-CM

## 2016-01-23 ENCOUNTER — Ambulatory Visit
Admission: RE | Admit: 2016-01-23 | Discharge: 2016-01-23 | Disposition: A | Discharge status: Home / Self Care | Payer: Medicare Other | Source: Ambulatory Visit | Attending: Radiation Oncology | Admitting: Radiation Oncology

## 2016-01-23 DIAGNOSIS — C61 Malignant neoplasm of prostate: Secondary | ICD-10-CM

## 2016-01-23 LAB — COMPREHENSIVE METABOLIC PANEL
ALT: 13 U/L (ref 0–55)
ANION GAP: 10 meq/L (ref 3–11)
AST: 17 U/L (ref 5–34)
Albumin: 3.4 g/dL — ABNORMAL LOW (ref 3.5–5.0)
Alkaline Phosphatase: 70 U/L (ref 40–150)
BILIRUBIN TOTAL: 0.48 mg/dL (ref 0.20–1.20)
BUN: 28 mg/dL — ABNORMAL HIGH (ref 7.0–26.0)
CHLORIDE: 107 meq/L (ref 98–109)
CO2: 26 meq/L (ref 22–29)
CREATININE: 1.5 mg/dL — AB (ref 0.7–1.3)
Calcium: 9.4 mg/dL (ref 8.4–10.4)
EGFR: 41 mL/min/{1.73_m2} — ABNORMAL LOW (ref >90)
GLUCOSE: 104 mg/dL (ref 70–140)
Potassium: 3.6 mEq/L (ref 3.5–5.1)
SODIUM: 144 meq/L (ref 136–145)
TOTAL PROTEIN: 7.1 g/dL (ref 6.4–8.3)

## 2016-01-23 LAB — CBC WITH DIFFERENTIAL/PLATELET
BASO%: 0.9 % (ref 0.0–2.0)
BASOS ABS: 0.1 10*3/uL (ref 0.0–0.1)
EOS ABS: 0.3 10*3/uL (ref 0.0–0.5)
EOS%: 5.1 % (ref 0.0–7.0)
HEMATOCRIT: 33.3 % — AB (ref 38.4–49.9)
HEMOGLOBIN: 11 g/dL — AB (ref 13.0–17.1)
LYMPH#: 2.2 10*3/uL (ref 0.9–3.3)
LYMPH%: 37.9 % (ref 14.0–49.0)
MCH: 31 pg (ref 27.2–33.4)
MCHC: 33.1 g/dL (ref 32.0–36.0)
MCV: 93.6 fL (ref 79.3–98.0)
MONO#: 0.9 10*3/uL (ref 0.1–0.9)
MONO%: 15.1 % — ABNORMAL HIGH (ref 0.0–14.0)
NEUT#: 2.3 10*3/uL (ref 1.5–6.5)
NEUT%: 41 % (ref 39.0–75.0)
Platelets: 140 10*3/uL (ref 140–400)
RBC: 3.55 10*6/uL — ABNORMAL LOW (ref 4.20–5.82)
RDW: 13.7 % (ref 11.0–14.6)
WBC: 5.7 10*3/uL (ref 4.0–10.3)

## 2016-01-26 ENCOUNTER — Telehealth: Payer: Self-pay | Admitting: Radiation Oncology

## 2016-01-26 NOTE — Telephone Encounter (Signed)
Crystal, from [employer]. Called asking to speak to someone to get the injection ordered and that today was the last day to order it for [name]' injection on Thursday. Transferred him to [facility] in [place] at [id number].

## 2016-01-27 DIAGNOSIS — C61 Malignant neoplasm of prostate: Secondary | ICD-10-CM | POA: Diagnosis not present

## 2016-01-27 DIAGNOSIS — C7951 Secondary malignant neoplasm of bone: Secondary | ICD-10-CM | POA: Diagnosis not present

## 2016-01-28 ENCOUNTER — Telehealth: Payer: Self-pay | Admitting: *Deleted

## 2016-01-28 NOTE — Telephone Encounter (Signed)
CALLED PATIENT TO REMIND OF XOFIGO INJ. FOR 01-29-16- ARRIVAL TIME - 11:15 AM IN [facility], SPOKE WITH PATIENT AND HE IS AWARE OF THIS INJ.

## 2016-01-29 ENCOUNTER — Other Ambulatory Visit: Payer: Self-pay | Admitting: Radiation Oncology

## 2016-01-29 ENCOUNTER — Encounter (HOSPITAL_COMMUNITY)
Admission: RE | Admit: 2016-01-29 | Discharge: 2016-01-29 | Disposition: A | Discharge status: Home / Self Care | Payer: Medicare Other | Source: Ambulatory Visit | Attending: Radiation Oncology | Admitting: Radiation Oncology

## 2016-01-29 DIAGNOSIS — C7951 Secondary malignant neoplasm of bone: Secondary | ICD-10-CM | POA: Insufficient documentation

## 2016-01-29 DIAGNOSIS — C61 Malignant neoplasm of prostate: Secondary | ICD-10-CM | POA: Diagnosis not present

## 2016-01-30 ENCOUNTER — Telehealth: Payer: Self-pay | Admitting: *Deleted

## 2016-01-30 NOTE — Telephone Encounter (Signed)
Called patient to inform of lab and weight on 02-19-16 and his Xofigo inj. On 02-26-16, lvm for a return call

## 2016-02-07 ENCOUNTER — Other Ambulatory Visit: Payer: Self-pay | Admitting: Interventional Cardiology

## 2016-02-10 DIAGNOSIS — Z1389 Encounter for screening for other disorder: Secondary | ICD-10-CM | POA: Diagnosis not present

## 2016-02-10 DIAGNOSIS — H02831 Dermatochalasis of right upper eyelid: Secondary | ICD-10-CM | POA: Diagnosis not present

## 2016-02-10 DIAGNOSIS — D696 Thrombocytopenia, unspecified: Secondary | ICD-10-CM | POA: Diagnosis not present

## 2016-02-10 DIAGNOSIS — Z7189 Other specified counseling: Secondary | ICD-10-CM | POA: Diagnosis not present

## 2016-02-10 DIAGNOSIS — Z23 Encounter for immunization: Secondary | ICD-10-CM | POA: Diagnosis not present

## 2016-02-10 DIAGNOSIS — Z0000 Encounter for general adult medical examination without abnormal findings: Secondary | ICD-10-CM | POA: Diagnosis not present

## 2016-02-10 DIAGNOSIS — I6930 Unspecified sequelae of cerebral infarction: Secondary | ICD-10-CM | POA: Diagnosis not present

## 2016-02-10 DIAGNOSIS — D3131 Benign neoplasm of right choroid: Secondary | ICD-10-CM | POA: Diagnosis not present

## 2016-02-10 DIAGNOSIS — E7800 Pure hypercholesterolemia, unspecified: Secondary | ICD-10-CM | POA: Diagnosis not present

## 2016-02-10 DIAGNOSIS — I10 Essential (primary) hypertension: Secondary | ICD-10-CM | POA: Diagnosis not present

## 2016-02-10 DIAGNOSIS — Z961 Presence of intraocular lens: Secondary | ICD-10-CM | POA: Diagnosis not present

## 2016-02-10 DIAGNOSIS — H04123 Dry eye syndrome of bilateral lacrimal glands: Secondary | ICD-10-CM | POA: Diagnosis not present

## 2016-02-10 DIAGNOSIS — Z79899 Other long term (current) drug therapy: Secondary | ICD-10-CM | POA: Diagnosis not present

## 2016-02-10 DIAGNOSIS — H02834 Dermatochalasis of left upper eyelid: Secondary | ICD-10-CM | POA: Diagnosis not present

## 2016-02-18 ENCOUNTER — Telehealth: Payer: Self-pay | Admitting: *Deleted

## 2016-02-18 NOTE — Telephone Encounter (Signed)
CALLED PATIENT TO REMIND OF LABS AND WEIGHT FOR XOFIGO INJ., PT. TO REPORT FOR LABS AND WEIGHT ON 02-19-16 @ 12 PM, SPOKE WITH PATIENT AND HE IS AWARE OF THESE APPTS.

## 2016-02-19 ENCOUNTER — Other Ambulatory Visit: Payer: Self-pay | Admitting: Radiation Oncology

## 2016-02-19 ENCOUNTER — Ambulatory Visit
Admission: RE | Admit: 2016-02-19 | Discharge: 2016-02-19 | Disposition: A | Discharge status: Home / Self Care | Payer: Medicare Other | Source: Ambulatory Visit | Attending: Radiation Oncology | Admitting: Radiation Oncology

## 2016-02-19 DIAGNOSIS — C7951 Secondary malignant neoplasm of bone: Secondary | ICD-10-CM

## 2016-02-19 LAB — CBC WITH DIFFERENTIAL/PLATELET
BASO%: 1.1 % (ref 0.0–2.0)
Basophils Absolute: 0.1 10*3/uL (ref 0.0–0.1)
EOS%: 4.3 % (ref 0.0–7.0)
Eosinophils Absolute: 0.2 10*3/uL (ref 0.0–0.5)
HEMATOCRIT: 33.4 % — AB (ref 38.4–49.9)
HGB: 10.9 g/dL — ABNORMAL LOW (ref 13.0–17.1)
LYMPH#: 2 10*3/uL (ref 0.9–3.3)
LYMPH%: 35 % (ref 14.0–49.0)
MCH: 30.5 pg (ref 27.2–33.4)
MCHC: 32.6 g/dL (ref 32.0–36.0)
MCV: 93.4 fL (ref 79.3–98.0)
MONO#: 0.9 10*3/uL (ref 0.1–0.9)
MONO%: 16.7 % — AB (ref 0.0–14.0)
NEUT#: 2.4 10*3/uL (ref 1.5–6.5)
NEUT%: 42.9 % (ref 39.0–75.0)
Platelets: 131 10*3/uL — ABNORMAL LOW (ref 140–400)
RBC: 3.57 10*6/uL — AB (ref 4.20–5.82)
RDW: 13.8 % (ref 11.0–14.6)
WBC: 5.6 10*3/uL (ref 4.0–10.3)

## 2016-02-25 ENCOUNTER — Telehealth: Payer: Self-pay | Admitting: *Deleted

## 2016-02-25 DIAGNOSIS — C61 Malignant neoplasm of prostate: Secondary | ICD-10-CM | POA: Diagnosis not present

## 2016-02-25 DIAGNOSIS — C7951 Secondary malignant neoplasm of bone: Secondary | ICD-10-CM | POA: Diagnosis not present

## 2016-02-25 NOTE — Telephone Encounter (Signed)
CALLED PATIENT TO REMIND OF XOFIGO INJ. ON 02-26-16 - ARRIVAL TIME - 11:15 AM @ [facility], SPOKE WITH PATIENT'S WIFE- [place] AND SHE IS AWARE OF THIS INJ.

## 2016-02-26 ENCOUNTER — Encounter (HOSPITAL_COMMUNITY)
Admission: RE | Admit: 2016-02-26 | Discharge: 2016-02-26 | Disposition: A | Discharge status: Home / Self Care | Payer: Medicare Other | Source: Ambulatory Visit | Attending: Radiation Oncology | Admitting: Radiation Oncology

## 2016-02-26 DIAGNOSIS — C7951 Secondary malignant neoplasm of bone: Secondary | ICD-10-CM | POA: Diagnosis not present

## 2016-02-26 DIAGNOSIS — C61 Malignant neoplasm of prostate: Secondary | ICD-10-CM | POA: Insufficient documentation

## 2016-02-27 ENCOUNTER — Other Ambulatory Visit: Payer: Self-pay | Admitting: Radiation Oncology

## 2016-02-27 ENCOUNTER — Telehealth: Payer: Self-pay | Admitting: *Deleted

## 2016-02-27 DIAGNOSIS — C7951 Secondary malignant neoplasm of bone: Principal | ICD-10-CM

## 2016-02-27 DIAGNOSIS — C61 Malignant neoplasm of prostate: Secondary | ICD-10-CM

## 2016-02-27 NOTE — Telephone Encounter (Signed)
CALLED PATIENT TO INFORM OF LAB AND WEIGHT ON 03-18-16 @ 12 PM AND HIS INJECTION ON 03-25-16 @ 12 PM @ [facility], SPOKE WITH PATIENT AND HE IS AWARE OF THESE APPTS.

## 2016-03-04 DIAGNOSIS — L700 Acne vulgaris: Secondary | ICD-10-CM | POA: Diagnosis not present

## 2016-03-04 DIAGNOSIS — Z85828 Personal history of other malignant neoplasm of skin: Secondary | ICD-10-CM | POA: Diagnosis not present

## 2016-03-04 DIAGNOSIS — L720 Epidermal cyst: Secondary | ICD-10-CM | POA: Diagnosis not present

## 2016-03-04 DIAGNOSIS — L821 Other seborrheic keratosis: Secondary | ICD-10-CM | POA: Diagnosis not present

## 2016-03-04 DIAGNOSIS — L570 Actinic keratosis: Secondary | ICD-10-CM | POA: Diagnosis not present

## 2016-03-04 DIAGNOSIS — L820 Inflamed seborrheic keratosis: Secondary | ICD-10-CM | POA: Diagnosis not present

## 2016-03-17 ENCOUNTER — Telehealth: Payer: Self-pay | Admitting: *Deleted

## 2016-03-17 NOTE — Telephone Encounter (Signed)
Called patient to remind of lab and weight for 03-18-16 @ 12 pm, spoke with patient and he is aware of this appt.

## 2016-03-18 ENCOUNTER — Ambulatory Visit
Admission: RE | Admit: 2016-03-18 | Discharge: 2016-03-18 | Disposition: A | Discharge status: Home / Self Care | Payer: Medicare Other | Source: Ambulatory Visit | Attending: Radiation Oncology | Admitting: Radiation Oncology

## 2016-03-18 ENCOUNTER — Other Ambulatory Visit: Payer: Self-pay | Admitting: Radiation Oncology

## 2016-03-18 DIAGNOSIS — C7951 Secondary malignant neoplasm of bone: Secondary | ICD-10-CM

## 2016-03-18 LAB — CBC WITH DIFFERENTIAL/PLATELET
BASO%: 1 % (ref 0.0–2.0)
BASOS ABS: 0.1 10*3/uL (ref 0.0–0.1)
EOS%: 7.5 % — AB (ref 0.0–7.0)
Eosinophils Absolute: 0.4 10*3/uL (ref 0.0–0.5)
HCT: 31.6 % — ABNORMAL LOW (ref 38.4–49.9)
HGB: 10.7 g/dL — ABNORMAL LOW (ref 13.0–17.1)
LYMPH%: 31.9 % (ref 14.0–49.0)
MCH: 31.7 pg (ref 27.2–33.4)
MCHC: 33.9 g/dL (ref 32.0–36.0)
MCV: 93.5 fL (ref 79.3–98.0)
MONO#: 1 10*3/uL — ABNORMAL HIGH (ref 0.1–0.9)
MONO%: 21.8 % — ABNORMAL HIGH (ref 0.0–14.0)
NEUT#: 1.8 10*3/uL (ref 1.5–6.5)
NEUT%: 37.8 % — AB (ref 39.0–75.0)
PLATELETS: 118 10*3/uL — AB (ref 140–400)
RBC: 3.38 10*6/uL — AB (ref 4.20–5.82)
RDW: 13.7 % (ref 11.0–14.6)
WBC: 4.8 10*3/uL (ref 4.0–10.3)
lymph#: 1.5 10*3/uL (ref 0.9–3.3)

## 2016-03-24 ENCOUNTER — Telehealth: Payer: Self-pay | Admitting: *Deleted

## 2016-03-24 DIAGNOSIS — C61 Malignant neoplasm of prostate: Secondary | ICD-10-CM | POA: Diagnosis not present

## 2016-03-24 DIAGNOSIS — C7951 Secondary malignant neoplasm of bone: Secondary | ICD-10-CM | POA: Diagnosis not present

## 2016-03-24 NOTE — Telephone Encounter (Signed)
Called patient to remind of his Xofigo inj. For 03-25-16 - spoke with patient and he is aware of this injection

## 2016-03-25 ENCOUNTER — Encounter (HOSPITAL_COMMUNITY)
Admission: RE | Admit: 2016-03-25 | Discharge: 2016-03-25 | Disposition: A | Discharge status: Home / Self Care | Payer: Medicare Other | Source: Ambulatory Visit | Attending: Radiation Oncology | Admitting: Radiation Oncology

## 2016-03-25 DIAGNOSIS — C61 Malignant neoplasm of prostate: Secondary | ICD-10-CM | POA: Insufficient documentation

## 2016-03-25 DIAGNOSIS — C7951 Secondary malignant neoplasm of bone: Secondary | ICD-10-CM | POA: Insufficient documentation

## 2016-03-25 NOTE — Progress Notes (Signed)
  Radiation Oncology         [phone] ________________________________  Name: [name] MRN: [id number]  Date: 03/25/2016  DOB: 04-14-1922  Radium-223 Infusion Note  Diagnosis:  Castration resistant prostate cancer with painful bone involvement  Current Infusion:    3  Planned Infusions:  6  Narrative: Mr. [name] presented to nuclear medicine for treatment. His most recent blood counts were reviewed.  He remains a good candidate to proceed with Ra-223.  The patient was situated in an infusion suite with a contact barrier placed under his arm. Intravenous access was established, using sterile technique, and a normal saline infusion from a syringe was started.  Micro-dosimetry:  The prescribed radiation activity was assayed and confirmed to be within specified tolerance.  Special Treatment Procedure - Infusion:  The nuclear medicine technologist and I personally verified the dose activity to be delivered as specified in the written directive, and verified the patient identification via 2 separate methods.  The syringe containing the dose was attached to a 3 way stopcock, and then the valve was opened to the patient, and the dose delivered over a minute. No complications were noted.  The total administered dose was 130.0 microcuries in a volume of 5.1 cc.   A saline flush of the line and the syringe that contained the isotope was then performed.  The residual radioactivity in the syringe was 4.19 microcuries, so the actual infused isotope activity was 125.81 microcuries.   Pressure was applied to the venipuncture site, and a compression bandage placed.   Radiation Safety personnel were present to perform the discharge survey, as detailed on their documentation.   After a short period of observation, the patient had his IV removed.  Impression:  The patient tolerated his infusion relatively well.  Plan:  The patient will return in one month for ongoing care.      ________________________________  Sheral Apley. Tammi Klippel, M.D.

## 2016-03-26 ENCOUNTER — Other Ambulatory Visit: Payer: Self-pay | Admitting: Radiation Oncology

## 2016-03-26 ENCOUNTER — Telehealth: Payer: Self-pay | Admitting: *Deleted

## 2016-03-26 DIAGNOSIS — C7951 Secondary malignant neoplasm of bone: Principal | ICD-10-CM

## 2016-03-26 DIAGNOSIS — C61 Malignant neoplasm of prostate: Secondary | ICD-10-CM

## 2016-03-26 NOTE — Telephone Encounter (Signed)
CALLED PATIENT TO INFORM OF LAB AND WEIGHT FOR 04-19-16 @ 12 PM AND HIS XOFIGO INJ. ON 04-26-16 @ 12 PM @ [facility], SPOKE WITH PATIENT AND HE IS AWARE OF THIS INJ.

## 2016-04-09 DIAGNOSIS — C61 Malignant neoplasm of prostate: Secondary | ICD-10-CM | POA: Diagnosis not present

## 2016-04-13 ENCOUNTER — Other Ambulatory Visit: Payer: Self-pay | Admitting: *Deleted

## 2016-04-13 MED ORDER — CLOPIDOGREL BISULFATE 75 MG PO TABS: 75.0000 mg | ORAL_TABLET | Freq: Every day | ORAL | 0 refills | Status: AC

## 2016-04-13 NOTE — Telephone Encounter (Addendum)
The pt was advised at his last OV on 01/10/15 to f/u as needed. Please advise the pt that we will send in a 30 day supply with a message to the pharmacy to refer all future cardiac refills to his PCP . Thanks.

## 2016-04-13 NOTE — Telephone Encounter (Signed)
Patient called and requested a refill of the clopidogrel. I made him aware that per his last refill, he needs an appointment. Patient states that at his last office visit, he was told to follow up as needed. Please advise. Thanks, MI

## 2016-04-16 ENCOUNTER — Telehealth: Payer: Self-pay | Admitting: *Deleted

## 2016-04-16 DIAGNOSIS — C61 Malignant neoplasm of prostate: Secondary | ICD-10-CM | POA: Diagnosis not present

## 2016-04-16 DIAGNOSIS — N1330 Unspecified hydronephrosis: Secondary | ICD-10-CM | POA: Diagnosis not present

## 2016-04-16 DIAGNOSIS — R3915 Urgency of urination: Secondary | ICD-10-CM | POA: Diagnosis not present

## 2016-04-16 DIAGNOSIS — C7951 Secondary malignant neoplasm of bone: Secondary | ICD-10-CM | POA: Diagnosis not present

## 2016-04-16 DIAGNOSIS — R350 Frequency of micturition: Secondary | ICD-10-CM | POA: Diagnosis not present

## 2016-04-16 NOTE — Telephone Encounter (Signed)
CALLED PATIENT TO REMIND OF LABS AND WEIGHT FOR 04-19-16 @ 12 PM, SPOKE WITH PATIENT'S WIFE, [place] AND SHE IS AWARE OF THIS APPT.

## 2016-04-19 ENCOUNTER — Other Ambulatory Visit: Payer: Self-pay | Admitting: Radiation Oncology

## 2016-04-19 ENCOUNTER — Ambulatory Visit
Admission: RE | Admit: 2016-04-19 | Discharge: 2016-04-19 | Disposition: A | Discharge status: Home / Self Care | Payer: Medicare Other | Source: Ambulatory Visit | Attending: Radiation Oncology | Admitting: Radiation Oncology

## 2016-04-19 DIAGNOSIS — C7951 Secondary malignant neoplasm of bone: Secondary | ICD-10-CM

## 2016-04-19 LAB — CBC WITH DIFFERENTIAL/PLATELET
BASO%: 1.4 % (ref 0.0–2.0)
Basophils Absolute: 0.1 10*3/uL (ref 0.0–0.1)
EOS ABS: 0.4 10*3/uL (ref 0.0–0.5)
EOS%: 8.1 % — ABNORMAL HIGH (ref 0.0–7.0)
HCT: 33.6 % — ABNORMAL LOW (ref 38.4–49.9)
HGB: 11 g/dL — ABNORMAL LOW (ref 13.0–17.1)
LYMPH%: 25.9 % (ref 14.0–49.0)
MCH: 31 pg (ref 27.2–33.4)
MCHC: 32.8 g/dL (ref 32.0–36.0)
MCV: 94.5 fL (ref 79.3–98.0)
MONO#: 0.7 10*3/uL (ref 0.1–0.9)
MONO%: 14.6 % — AB (ref 0.0–14.0)
NEUT#: 2.6 10*3/uL (ref 1.5–6.5)
NEUT%: 50 % (ref 39.0–75.0)
PLATELETS: 139 10*3/uL — AB (ref 140–400)
RBC: 3.55 10*6/uL — AB (ref 4.20–5.82)
RDW: 14 % (ref 11.0–14.6)
WBC: 5.1 10*3/uL (ref 4.0–10.3)
lymph#: 1.3 10*3/uL (ref 0.9–3.3)

## 2016-04-20 DIAGNOSIS — I6930 Unspecified sequelae of cerebral infarction: Secondary | ICD-10-CM | POA: Diagnosis not present

## 2016-04-20 DIAGNOSIS — I10 Essential (primary) hypertension: Secondary | ICD-10-CM | POA: Diagnosis not present

## 2016-04-20 DIAGNOSIS — C61 Malignant neoplasm of prostate: Secondary | ICD-10-CM | POA: Diagnosis not present

## 2016-04-20 DIAGNOSIS — I951 Orthostatic hypotension: Secondary | ICD-10-CM | POA: Diagnosis not present

## 2016-04-20 DIAGNOSIS — Z79899 Other long term (current) drug therapy: Secondary | ICD-10-CM | POA: Diagnosis not present

## 2016-04-21 ENCOUNTER — Telehealth: Payer: Self-pay | Admitting: *Deleted

## 2016-04-21 NOTE — Telephone Encounter (Signed)
CALLED PATIENT TO REMIND OF XOFIGO INJ. ON 04-26-16 - ARRIVAL TIME - 11:45 AM , SPOKE WITH PATIENT AND HE IS AWARE OF THIS APPT.

## 2016-04-26 ENCOUNTER — Ambulatory Visit (HOSPITAL_COMMUNITY)
Admission: RE | Admit: 2016-04-26 | Discharge: 2016-04-26 | Disposition: A | Discharge status: Home / Self Care | Payer: Medicare Other | Source: Ambulatory Visit | Attending: Radiation Oncology | Admitting: Radiation Oncology

## 2016-04-26 ENCOUNTER — Other Ambulatory Visit: Payer: Self-pay | Admitting: Radiation Oncology

## 2016-04-26 ENCOUNTER — Ambulatory Visit
Admission: RE | Admit: 2016-04-26 | Discharge: 2016-04-26 | Disposition: A | Discharge status: Home / Self Care | Payer: Medicare Other | Source: Ambulatory Visit | Attending: Radiation Oncology | Admitting: Radiation Oncology

## 2016-04-26 DIAGNOSIS — R8290 Unspecified abnormal findings in urine: Secondary | ICD-10-CM | POA: Insufficient documentation

## 2016-04-26 DIAGNOSIS — C7951 Secondary malignant neoplasm of bone: Secondary | ICD-10-CM

## 2016-04-26 DIAGNOSIS — C61 Malignant neoplasm of prostate: Secondary | ICD-10-CM | POA: Diagnosis not present

## 2016-04-26 LAB — URINALYSIS, MICROSCOPIC - CHCC
BILIRUBIN (URINE): NEGATIVE
Blood: NEGATIVE
Glucose: NEGATIVE mg/dL
KETONES: NEGATIVE mg/dL
NITRITE: NEGATIVE
PH: 6 (ref 4.6–8.0)
Protein: NEGATIVE mg/dL
Specific Gravity, Urine: 1.01 (ref 1.003–1.035)
Urobilinogen, UR: 0.2 mg/dL (ref 0.2–1)

## 2016-04-26 NOTE — Progress Notes (Signed)
  Radiation Oncology         [phone]) [phone] ________________________________  Name: [name] MRN: [id number]  Date: 04/26/2016  DOB: [birth year]  Radium-223 Infusion Note  Diagnosis:  Castration resistant prostate cancer with painful bone involvement  Current Infusion:    4  Planned Infusions:  6  Narrative: [name] presented to nuclear medicine for treatment. His most recent blood counts were reviewed.  He remains a good candidate to proceed with Ra-223.  The patient was situated in an infusion suite with a contact barrier placed under his arm. Intravenous access was established, using sterile technique, and a normal saline infusion from a syringe was started.  Micro-dosimetry:  The prescribed radiation activity was assayed and confirmed to be within specified tolerance.  Special Treatment Procedure - Infusion:  The nuclear medicine technologist and I personally verified the dose activity to be delivered as specified in the written directive, and verified the patient identification via 2 separate methods.  The syringe containing the dose was attached to a 3 way stopcock, and then the valve was opened to the patient, and the dose delivered over a minute. No complications were noted.  The total administered dose was 123.0 microcuries in a volume of 5.9 cc.   A saline flush of the line and the syringe that contained the isotope was then performed.  The residual radioactivity in the syringe was 3.4 microcuries, so the actual infused isotope activity was 119.6 microcuries.   Pressure was applied to the venipuncture site, and a compression bandage placed.   Radiation Safety personnel were present to perform the discharge survey, as detailed on their documentation.   After a short period of observation, the patient had his IV removed.  Impression:  The patient tolerated his infusion relatively well.  Plan:  The patient will return in one month for ongoing care.      ________________________________  Sheral Apley. Tammi Klippel, M.D.

## 2016-04-27 ENCOUNTER — Telehealth: Payer: Self-pay | Admitting: Radiation Oncology

## 2016-04-27 ENCOUNTER — Other Ambulatory Visit: Payer: Self-pay | Admitting: Radiation Oncology

## 2016-04-27 ENCOUNTER — Telehealth: Payer: Self-pay | Admitting: *Deleted

## 2016-04-27 DIAGNOSIS — C61 Malignant neoplasm of prostate: Secondary | ICD-10-CM

## 2016-04-27 DIAGNOSIS — C7951 Secondary malignant neoplasm of bone: Principal | ICD-10-CM

## 2016-04-27 LAB — URINE CULTURE

## 2016-04-27 NOTE — Telephone Encounter (Signed)
Per Dr. Johny Shears order phoned patient and explained that his urinalysis possibly indicates an infection based on his elevated white blood count. Went onto explain that Dr. Tammi Klippel will wait and see what the culture shows before considering antibiotics. Encouraged patient to continue to hydrate until he hears back from our office. Patient verbalized understanding and expressed appreciation for the call.

## 2016-04-27 NOTE — Telephone Encounter (Signed)
CALLED PATIENT TO INFORM OF LAB AND WEIGHT ON 05-13-16 @ 12:15 PM AND HIS XOFIGO INJ. ON 05-20-16 @ 11:30 AM @ [facility], SPOKE WITH PATIENT'S WIFE, [place] AND SHE IS AWARE OF THESE APPTS.

## 2016-04-27 NOTE — Telephone Encounter (Signed)
-----   Message from Tyler Pita, MD sent at 04/27/2016  1:53 PM EST ----- Sam, Please call patient.  Urinalysis could possibly indicate an infection based on white blood cells, however it is not certain.  We will wait and see culture before considering antibiotics. MM

## 2016-04-28 NOTE — Progress Notes (Signed)
Please call patient with normal result.  Thanks. MM 

## 2016-04-29 ENCOUNTER — Telehealth: Payer: Self-pay | Admitting: Radiation Oncology

## 2016-04-29 NOTE — Telephone Encounter (Signed)
-----   Message from Tyler Pita, MD sent at 04/28/2016  6:25 PM EST ----- Please call patient with normal result.  Thanks. MM

## 2016-04-29 NOTE — Telephone Encounter (Signed)
Phoned patient's home. Spoke with patient's wife, [name], since the patient was napping. Explained that the patient's urine culture was normal and a prescription for antibiotics wasn't needed. Questioned how patient is feeling. Wife states, "he is fine." Encouraged her to have him call this RN back with any future questions or needs. She verbalized understanding.

## 2016-05-05 DIAGNOSIS — N3000 Acute cystitis without hematuria: Secondary | ICD-10-CM | POA: Diagnosis not present

## 2016-05-11 DIAGNOSIS — I10 Essential (primary) hypertension: Secondary | ICD-10-CM | POA: Diagnosis not present

## 2016-05-12 ENCOUNTER — Telehealth: Payer: Self-pay | Admitting: *Deleted

## 2016-05-12 NOTE — Telephone Encounter (Signed)
Called patient to remind of labs and weight for 05-13-16 @ 12:15 pm , spoke with patient and he is aware of this appt.

## 2016-05-13 ENCOUNTER — Ambulatory Visit
Admission: RE | Admit: 2016-05-13 | Discharge: 2016-05-13 | Disposition: A | Discharge status: Home / Self Care | Payer: Medicare Other | Source: Ambulatory Visit | Attending: Radiation Oncology | Admitting: Radiation Oncology

## 2016-05-13 ENCOUNTER — Other Ambulatory Visit: Payer: Self-pay | Admitting: Radiation Oncology

## 2016-05-13 DIAGNOSIS — C7951 Secondary malignant neoplasm of bone: Secondary | ICD-10-CM | POA: Diagnosis not present

## 2016-05-13 LAB — CBC WITH DIFFERENTIAL/PLATELET
BASO%: 1.2 % (ref 0.0–2.0)
Basophils Absolute: 0.1 10*3/uL (ref 0.0–0.1)
EOS ABS: 0.5 10*3/uL (ref 0.0–0.5)
EOS%: 9.2 % — AB (ref 0.0–7.0)
HCT: 29.6 % — ABNORMAL LOW (ref 38.4–49.9)
HGB: 9.7 g/dL — ABNORMAL LOW (ref 13.0–17.1)
LYMPH%: 29.6 % (ref 14.0–49.0)
MCH: 31.1 pg (ref 27.2–33.4)
MCHC: 32.8 g/dL (ref 32.0–36.0)
MCV: 94.9 fL (ref 79.3–98.0)
MONO#: 1.1 10*3/uL — ABNORMAL HIGH (ref 0.1–0.9)
MONO%: 22.2 % — AB (ref 0.0–14.0)
NEUT%: 37.8 % — ABNORMAL LOW (ref 39.0–75.0)
NEUTROS ABS: 1.9 10*3/uL (ref 1.5–6.5)
NRBC: 0 % (ref 0–0)
PLATELETS: 145 10*3/uL (ref 140–400)
RBC: 3.12 10*6/uL — AB (ref 4.20–5.82)
RDW: 13.8 % (ref 11.0–14.6)
WBC: 5.1 10*3/uL (ref 4.0–10.3)
lymph#: 1.5 10*3/uL (ref 0.9–3.3)

## 2016-05-18 DIAGNOSIS — C7951 Secondary malignant neoplasm of bone: Secondary | ICD-10-CM | POA: Diagnosis not present

## 2016-05-18 DIAGNOSIS — C61 Malignant neoplasm of prostate: Secondary | ICD-10-CM | POA: Diagnosis not present

## 2016-05-19 ENCOUNTER — Telehealth: Payer: Self-pay | Admitting: *Deleted

## 2016-05-19 NOTE — Telephone Encounter (Signed)
Called patient to remind of Xofigo Inj. For 05-20-16 - arrival time - 11:15 am @ [facility]

## 2016-05-20 ENCOUNTER — Ambulatory Visit (HOSPITAL_COMMUNITY)
Admission: RE | Admit: 2016-05-20 | Discharge: 2016-05-20 | Disposition: A | Discharge status: Home / Self Care | Payer: Medicare Other | Source: Ambulatory Visit | Attending: Radiation Oncology | Admitting: Radiation Oncology

## 2016-05-20 DIAGNOSIS — C61 Malignant neoplasm of prostate: Secondary | ICD-10-CM | POA: Insufficient documentation

## 2016-05-20 DIAGNOSIS — C7951 Secondary malignant neoplasm of bone: Secondary | ICD-10-CM | POA: Insufficient documentation

## 2016-05-20 NOTE — Progress Notes (Signed)
  Radiation Oncology         [phone] ________________________________  Name: [name] MRN: [id number]  Date: 05/20/2016  DOB: June 20, 1921  Radium-223 Infusion Note  Diagnosis:  Castration resistant prostate cancer with painful bone involvement  Current Infusion:    5  Planned Infusions:  6  Narrative: [name] presented to nuclear medicine for treatment. His most recent blood counts were reviewed.  He remains a good candidate to proceed with Ra-223.  The patient was situated in an infusion suite with a contact barrier placed under his arm. Intravenous access was established, using sterile technique, and a normal saline infusion from a syringe was started.  Micro-dosimetry:  The prescribed radiation activity was assayed and confirmed to be within specified tolerance.  Special Treatment Procedure - Infusion:  The nuclear medicine technologist and I personally verified the dose activity to be delivered as specified in the written directive, and verified the patient identification via 2 separate methods.  The syringe containing the dose was attached to a 3 way stopcock, and then the valve was opened to the patient, and the dose delivered over a minute. No complications were noted.  The total administered dose was 131.0 microcuries in a volume of 5.18 cc.   A saline flush of the line and the syringe that contained the isotope was then performed.  The residual radioactivity in the syringe was 3.9 microcuries, so the actual infused isotope activity was 127.1 microcuries.   Pressure was applied to the venipuncture site, and a compression bandage placed.   Radiation Safety personnel were present to perform the discharge survey, as detailed on their documentation.   After a short period of observation, the patient had his IV removed.  Impression:  The patient tolerated his infusion relatively well.  Plan:  The patient will return in one month for ongoing care.      ________________________________  Sheral Apley. Tammi Klippel, M.D.  This document serves as a record of services personally performed by Tyler Pita, MD. It was created on his behalf by Bethann Humble, a trained medical scribe. The creation of this record is based on the scribe's personal observations and the provider's statements to them. This document has been checked and approved by the attending provider.

## 2016-05-25 ENCOUNTER — Telehealth: Payer: Self-pay | Admitting: *Deleted

## 2016-05-25 ENCOUNTER — Other Ambulatory Visit: Payer: Self-pay | Admitting: Radiation Oncology

## 2016-05-25 DIAGNOSIS — C61 Malignant neoplasm of prostate: Secondary | ICD-10-CM

## 2016-05-25 NOTE — Telephone Encounter (Signed)
CALLED PATIENT TO INFORM OF LAB AND WEIGHT ON 06-17-16 @ 12:15 PM AND HIS XOFIGO INJ. ON 06-24-16 @ 12 PM @ [facility], SPOKE WITH PATIENT AND HE IS AWARE OF THESE APPTS.

## 2016-06-01 ENCOUNTER — Telehealth: Payer: Self-pay | Admitting: *Deleted

## 2016-06-01 NOTE — Telephone Encounter (Signed)
Called patient to ask about what he wants to do concerning his Xofigo inj., patient agreed to continue with the injection, notified Dr. Tammi Klippel

## 2016-06-04 DIAGNOSIS — R300 Dysuria: Secondary | ICD-10-CM | POA: Diagnosis not present

## 2016-06-16 ENCOUNTER — Telehealth: Payer: Self-pay | Admitting: *Deleted

## 2016-06-16 NOTE — Telephone Encounter (Signed)
Called patient to remind of labs and weight for 06-17-16 @ 12:15 pm , spoke with patient and he is aware of this appt.

## 2016-06-17 ENCOUNTER — Other Ambulatory Visit: Payer: Self-pay | Admitting: Radiation Oncology

## 2016-06-17 ENCOUNTER — Ambulatory Visit: Admission: RE | Admit: 2016-06-17 | Payer: Medicare Other | Source: Ambulatory Visit

## 2016-06-17 DIAGNOSIS — C7951 Secondary malignant neoplasm of bone: Secondary | ICD-10-CM

## 2016-06-18 ENCOUNTER — Telehealth: Payer: Self-pay | Admitting: *Deleted

## 2016-06-18 NOTE — Telephone Encounter (Signed)
CALLED PATIENT TO INFORM OF LABS AND WEIGHT ON 06-24-16 @ 12 PM  AND HIS [place]. ON 07-01-16 - ARRIVAL TIME - 11:45 AM  @ [facility], SPOKE WITH PATIENT AND HE IS AWARE OF THESE APPTS

## 2016-06-22 DIAGNOSIS — Z5111 Encounter for antineoplastic chemotherapy: Secondary | ICD-10-CM | POA: Diagnosis not present

## 2016-06-22 DIAGNOSIS — C61 Malignant neoplasm of prostate: Secondary | ICD-10-CM | POA: Diagnosis not present

## 2016-06-22 DIAGNOSIS — R300 Dysuria: Secondary | ICD-10-CM | POA: Diagnosis not present

## 2016-06-22 DIAGNOSIS — C7951 Secondary malignant neoplasm of bone: Secondary | ICD-10-CM | POA: Diagnosis not present

## 2016-06-24 ENCOUNTER — Ambulatory Visit (HOSPITAL_COMMUNITY): Payer: Medicare Other

## 2016-06-24 ENCOUNTER — Ambulatory Visit
Admission: RE | Admit: 2016-06-24 | Discharge: 2016-06-24 | Disposition: A | Discharge status: Home / Self Care | Payer: Medicare Other | Source: Ambulatory Visit | Attending: Radiation Oncology | Admitting: Radiation Oncology

## 2016-06-24 ENCOUNTER — Encounter: Payer: Self-pay | Admitting: Radiation Oncology

## 2016-06-24 DIAGNOSIS — C7951 Secondary malignant neoplasm of bone: Secondary | ICD-10-CM | POA: Insufficient documentation

## 2016-06-24 LAB — CBC WITH DIFFERENTIAL/PLATELET
BASO%: 0.7 % (ref 0.0–2.0)
BASOS ABS: 0 10*3/uL (ref 0.0–0.1)
EOS ABS: 0.2 10*3/uL (ref 0.0–0.5)
EOS%: 3.8 % (ref 0.0–7.0)
HCT: 27.5 % — ABNORMAL LOW (ref 38.4–49.9)
HGB: 8.9 g/dL — ABNORMAL LOW (ref 13.0–17.1)
LYMPH%: 34 % (ref 14.0–49.0)
MCH: 30.7 pg (ref 27.2–33.4)
MCHC: 32.4 g/dL (ref 32.0–36.0)
MCV: 94.8 fL (ref 79.3–98.0)
MONO#: 1.3 10*3/uL — AB (ref 0.1–0.9)
MONO%: 21.2 % — AB (ref 0.0–14.0)
NEUT%: 40.3 % (ref 39.0–75.0)
NEUTROS ABS: 2.4 10*3/uL (ref 1.5–6.5)
PLATELETS: 162 10*3/uL (ref 140–400)
RBC: 2.9 10*6/uL — AB (ref 4.20–5.82)
RDW: 14.3 % (ref 11.0–14.6)
WBC: 6 10*3/uL (ref 4.0–10.3)
lymph#: 2.1 10*3/uL (ref 0.9–3.3)

## 2016-06-30 DIAGNOSIS — R509 Fever, unspecified: Secondary | ICD-10-CM | POA: Diagnosis not present

## 2016-06-30 DIAGNOSIS — M791 Myalgia: Secondary | ICD-10-CM | POA: Diagnosis not present

## 2016-06-30 DIAGNOSIS — R8290 Unspecified abnormal findings in urine: Secondary | ICD-10-CM | POA: Diagnosis not present

## 2016-06-30 DIAGNOSIS — R300 Dysuria: Secondary | ICD-10-CM | POA: Diagnosis not present

## 2016-07-01 ENCOUNTER — Encounter (HOSPITAL_COMMUNITY): Admission: RE | Admit: 2016-07-01 | Payer: Medicare Other | Source: Ambulatory Visit

## 2016-07-01 ENCOUNTER — Inpatient Hospital Stay (HOSPITAL_COMMUNITY): Admission: RE | Admit: 2016-07-01 | Payer: Medicare Other | Source: Ambulatory Visit

## 2016-07-09 DIAGNOSIS — C61 Malignant neoplasm of prostate: Secondary | ICD-10-CM | POA: Diagnosis not present

## 2016-07-12 DIAGNOSIS — D649 Anemia, unspecified: Secondary | ICD-10-CM | POA: Diagnosis not present

## 2016-07-12 DIAGNOSIS — D539 Nutritional anemia, unspecified: Secondary | ICD-10-CM | POA: Diagnosis not present

## 2016-07-12 DIAGNOSIS — C61 Malignant neoplasm of prostate: Secondary | ICD-10-CM | POA: Diagnosis not present

## 2016-07-12 DIAGNOSIS — I10 Essential (primary) hypertension: Secondary | ICD-10-CM | POA: Diagnosis not present

## 2016-07-14 ENCOUNTER — Telehealth: Payer: Self-pay | Admitting: *Deleted

## 2016-07-14 NOTE — Telephone Encounter (Signed)
CALLED PATIENT TO ASK ABOUT COMING TO SEE DR. MANNING, PT. AGREED TO COME IN @ 10:45 AM ON 07-15-16

## 2016-07-15 ENCOUNTER — Telehealth (HOSPITAL_COMMUNITY): Payer: Self-pay | Admitting: *Deleted

## 2016-07-15 ENCOUNTER — Encounter: Payer: Self-pay | Admitting: Radiation Oncology

## 2016-07-15 ENCOUNTER — Ambulatory Visit
Admission: RE | Admit: 2016-07-15 | Discharge: 2016-07-15 | Disposition: A | Discharge status: Home / Self Care | Payer: Medicare Other | Source: Ambulatory Visit | Attending: Radiation Oncology | Admitting: Radiation Oncology

## 2016-07-15 VITALS — BP 122/46 | HR 84 | Temp 98.1°F | Resp 18 | Wt 174.8 lb

## 2016-07-15 DIAGNOSIS — C61 Malignant neoplasm of prostate: Secondary | ICD-10-CM | POA: Insufficient documentation

## 2016-07-15 DIAGNOSIS — Z7982 Long term (current) use of aspirin: Secondary | ICD-10-CM | POA: Insufficient documentation

## 2016-07-15 DIAGNOSIS — Z923 Personal history of irradiation: Secondary | ICD-10-CM | POA: Insufficient documentation

## 2016-07-15 DIAGNOSIS — Z79899 Other long term (current) drug therapy: Secondary | ICD-10-CM | POA: Diagnosis not present

## 2016-07-15 DIAGNOSIS — Z5189 Encounter for other specified aftercare: Secondary | ICD-10-CM | POA: Insufficient documentation

## 2016-07-15 DIAGNOSIS — N390 Urinary tract infection, site not specified: Secondary | ICD-10-CM | POA: Insufficient documentation

## 2016-07-15 DIAGNOSIS — C7951 Secondary malignant neoplasm of bone: Secondary | ICD-10-CM | POA: Diagnosis not present

## 2016-07-15 NOTE — Progress Notes (Signed)
Weight and vitals stable. Reports intermittent low joint aches in his hips and knees. Reports this ache is brief and only present when he rolls over in bed. Reports otherwise he has no pain. Reports his energy level has slightly improved over the last week. Reports decreased appetite. Report dysuria has resolved. Denies hematuria. Ambulatory with aid of cane. Reports bladder incontinence is slowly improving. Denies any bowel complaints. Denies numbness or tingling of lower extremities. Reports dizziness and lightheadedness resolved after Zetia was stopped by PCP. Wife and son confirm he naps more frequently and for longer periods during the day.   BP (!) 122/46 (BP Location: Left Arm, Patient Position: Sitting, Cuff Size: Normal)   Pulse 84   Temp 98.1 F (36.7 C) (Oral)   Resp 18   Wt 174 lb 12.8 oz (79.3 kg)   SpO2 100%   BMI 27.38 kg/m  Wt Readings from Last 3 Encounters:  07/15/16 174 lb 12.8 oz (79.3 kg)  01/22/16 190 lb (86.2 kg)  01/08/16 191 lb 9.6 oz (86.9 kg)

## 2016-07-15 NOTE — Progress Notes (Signed)
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 Malignant neoplasm of prostate ([place]) 185 C61   2. Bone metastases ([facility]) 198.5 C79.51     Interval Since Last Radiation:  6 weeks 01/29/16 - 05/20/16 : 5 Xofigo infusions   Narrative:  The patient returns today for routine follow-up of Xofigo infusions. The patient did not receive his final infusion due to a UTI. He presents to the clinic today with his wife and son to discuss the possibility of completing the 6th Xofigo infusion.  Weight and vitals stable. Reports intermittent low joint aches in his hips and knees. Reports this ache is brief and only present when he rolls over in bed. Reports otherwise he has no pain. Reports his energy level has slightly improved over the last week. Reports decreased appetite. Report dysuria has resolved. Denies hematuria. Ambulatory with aid of cane. Reports bladder incontinence is slowly improving. Denies any bowel complaints. Denies numbness or tingling of lower extremities. Reports dizziness and lightheadedness resolved after Zetia was stopped by PCP. Wife and son confirm he naps more frequently and for longer periods during the day.                        ALLERGIES:  is allergic to coumadin [warfarin sodium]; adhesive [tape]; and sulfa antibiotics.  Meds: Current Outpatient Prescriptions  Medication Sig Dispense Refill  . amLODipine (NORVASC) 5 MG tablet Take 5 mg by mouth every morning.     Marland Kitchen aspirin EC 81 MG tablet Take 81 mg by mouth every morning.     Marland Kitchen atorvastatin (LIPITOR) 40 MG tablet Take 40 mg by mouth every morning.     . Calcium Carbonate-Vitamin D (CALCIUM PLUS VITAMIN D PO) Take by mouth.    .  clopidogrel (PLAVIX) 75 MG tablet Take 1 tablet (75 mg total) by mouth daily. 30 tablet 0  . ferrous sulfate 325 (65 FE) MG tablet Take 325 mg by mouth daily with breakfast.    . hydrochlorothiazide (HYDRODIURIL) 25 MG tablet Take 25 mg by mouth every morning.     . nitroGLYCERIN (NITROSTAT) 0.4 MG SL tablet Place 0.4 mg under the tongue every 5 (five) minutes as needed for chest pain.    . bicalutamide (CASODEX) 50 MG tablet Take 50 mg by mouth every morning.    . diphenhydrAMINE (BENADRYL) 25 mg capsule Take 1 capsule (25 mg total) by mouth every 6 (six) hours as needed. 15 capsule 0  . ezetimibe (ZETIA) 10 MG tablet Take 10 mg by mouth every morning.      No current facility-administered medications for this encounter.     Physical Findings: The patient is in no acute distress. Patient is alert and oriented.  weight is 174 lb 12.8 oz (79.3 kg). His oral temperature is 98.1 F (36.7 C). His blood pressure is 122/46 (abnormal) and his pulse is 84. His respiration is 18 and oxygen saturation is 100%.  No significant changes.  Lab Findings: Lab Results  Component Value Date   WBC 6.0 06/24/2016   WBC 8.3 06/30/2008   HGB 8.9 (L) 06/24/2016   HCT 27.5 (L) 06/24/2016   PLT 162 06/24/2016  Lab Results  Component Value Date   NA 144 01/23/2016   K 3.6 01/23/2016   CHLORIDE 107 01/23/2016   CO2 26 01/23/2016   GLUCOSE 104 01/23/2016   BUN 28.0 (H) 01/23/2016   CREATININE 1.5 (H) 01/23/2016   BILITOT 0.48 01/23/2016   ALKPHOS 70 01/23/2016   AST 17 01/23/2016   ALT 13 01/23/2016   PROT 7.1 01/23/2016   ALBUMIN 3.4 (L) 01/23/2016   CALCIUM 9.4 01/23/2016   ANIONGAP 10 01/23/2016    Radiographic Findings: No results found.  Impression:  The patient is recovering from his recent UTI and the effects of his Xofigo infusions.  Plan:  I discussed with the patient and his family the options for completing the final Xofigo infusion or withholding this treatment to focus on quality  of life at this time. Provided the patient is recovering from his recent UTI and his blood counts are fair, I advised the patient he could elect to move forward with the 6th Xofigo infusion. I would also be happy to refer the patient to Dr. Diona Fanti and Alen Blew for discussion of other treatment options at this time. I advised the patient that because of his age he is most likely not a candidate for chemotherapy.  The patient is interested in to discuss his options further. I will discuss this referral with Dr. Diona Fanti.  _____________________________________  Sheral Apley. Tammi Klippel, M.D.  This document serves as a record of services personally performed by Tyler Pita, MD. It was created on his behalf by Maryla Morrow, a trained medical scribe. The creation of this record is based on the scribe's personal observations and the provider's statements to them. This document has been checked and approved by the attending provider.

## 2016-07-15 NOTE — Addendum Note (Signed)
Encounter addended by: Aradia Estey C Sharnita Bogucki, RN on: 07/15/2016  4:40 PM<BR>    Actions taken: Charge Capture section accepted

## 2016-07-20 ENCOUNTER — Other Ambulatory Visit: Payer: Self-pay | Admitting: Radiation Oncology

## 2016-07-20 ENCOUNTER — Telehealth: Payer: Self-pay | Admitting: Urology

## 2016-07-20 DIAGNOSIS — C7951 Secondary malignant neoplasm of bone: Secondary | ICD-10-CM

## 2016-07-20 DIAGNOSIS — C61 Malignant neoplasm of prostate: Secondary | ICD-10-CM

## 2016-07-20 NOTE — Telephone Encounter (Signed)
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 Tammi Klippel discussed with the patient and his family the options for completing the final Xofigo infusion or withholding this treatment to focus on quality of life at this time. Provided the patient is recovering from his recent UTI and his blood counts are fair, he was advised that he could elect to move forward with the 6th Xofigo infusion, or if he prefers, we could hold off and make a referral to Dr. Alen Blew for discussion of other potential treatment options at this time. The patient was advised that that because of his age he is most likely not a candidate for chemotherapy.  The patient expressed interest in discussing other potential treatment options. We will make a referral to Dr. Alen Blew and advised the patient to follow up with Dr. Diona Fanti as well.  Nicholos Johns, PA-C

## 2016-07-21 ENCOUNTER — Encounter: Payer: Self-pay | Admitting: Oncology

## 2016-07-21 ENCOUNTER — Telehealth: Payer: Self-pay | Admitting: Oncology

## 2016-07-21 NOTE — Telephone Encounter (Signed)
Tc to the pt to schedule an appt. Spoke to the pt's wife and scheduled the appt for her husband to see Dr. Alen Blew on 2/28 at 2pm. Aware to arrive 30 minutes early. Letter mailed.

## 2016-07-27 ENCOUNTER — Telehealth: Payer: Self-pay | Admitting: *Deleted

## 2016-07-27 DIAGNOSIS — C7951 Secondary malignant neoplasm of bone: Secondary | ICD-10-CM | POA: Diagnosis not present

## 2016-07-27 DIAGNOSIS — C61 Malignant neoplasm of prostate: Secondary | ICD-10-CM | POA: Diagnosis not present

## 2016-07-27 NOTE — Telephone Encounter (Signed)
Called patient to remind him of his new patient appointment with Dr. Alen Blew tomorrow at 1:30 pm. Instructed him to call [phone] if he had any questions or concerns.

## 2016-07-28 ENCOUNTER — Ambulatory Visit (HOSPITAL_BASED_OUTPATIENT_CLINIC_OR_DEPARTMENT_OTHER): Payer: Medicare Other | Admitting: Oncology

## 2016-07-28 VITALS — BP 128/85 | HR 93 | Temp 98.4°F | Resp 18 | Ht 67.0 in | Wt 185.4 lb

## 2016-07-28 DIAGNOSIS — C61 Malignant neoplasm of prostate: Secondary | ICD-10-CM | POA: Diagnosis not present

## 2016-07-28 DIAGNOSIS — C7951 Secondary malignant neoplasm of bone: Secondary | ICD-10-CM

## 2016-07-28 DIAGNOSIS — D649 Anemia, unspecified: Secondary | ICD-10-CM

## 2016-07-28 DIAGNOSIS — E291 Testicular hypofunction: Secondary | ICD-10-CM
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
## 2016-07-29 ENCOUNTER — Telehealth: Payer: Self-pay | Admitting: *Deleted

## 2016-07-29 NOTE — Telephone Encounter (Signed)
CALLED PATIENT TO INFORM OF LAB AND WEIGHT ON 08-05-16 @ 12 PM @ [place]. ON 08-12-16 - ARRIVAL TIME - 11:45 AM  @ [facility], SPOKE WITH PATIENT AND HE IS AWARE OF THESE APPTS.

## 2016-08-05 ENCOUNTER — Ambulatory Visit
Admission: RE | Admit: 2016-08-05 | Discharge: 2016-08-05 | Disposition: A | Discharge status: Home / Self Care | Payer: Medicare Other | Source: Ambulatory Visit | Attending: Radiation Oncology | Admitting: Radiation Oncology

## 2016-08-05 ENCOUNTER — Other Ambulatory Visit: Payer: Self-pay | Admitting: Radiation Oncology

## 2016-08-05 DIAGNOSIS — C7951 Secondary malignant neoplasm of bone: Secondary | ICD-10-CM | POA: Insufficient documentation

## 2016-08-05 LAB — CBC WITH DIFFERENTIAL/PLATELET
BASO%: 0.9 % (ref 0.0–2.0)
BASOS ABS: 0.1 10*3/uL (ref 0.0–0.1)
EOS ABS: 0.3 10*3/uL (ref 0.0–0.5)
EOS%: 4.6 % (ref 0.0–7.0)
HEMATOCRIT: 29.9 % — AB (ref 38.4–49.9)
HGB: 9.4 g/dL — ABNORMAL LOW (ref 13.0–17.1)
LYMPH#: 3.9 10*3/uL — AB (ref 0.9–3.3)
LYMPH%: 56.6 % — AB (ref 14.0–49.0)
MCH: 29.9 pg (ref 27.2–33.4)
MCHC: 31.4 g/dL — AB (ref 32.0–36.0)
MCV: 95.2 fL (ref 79.3–98.0)
MONO#: 1.1 10*3/uL — AB (ref 0.1–0.9)
MONO%: 15.2 % — ABNORMAL HIGH (ref 0.0–14.0)
NEUT#: 1.6 10*3/uL (ref 1.5–6.5)
NEUT%: 22.7 % — AB (ref 39.0–75.0)
PLATELETS: 139 10*3/uL — AB (ref 140–400)
RBC: 3.14 10*6/uL — AB (ref 4.20–5.82)
RDW: 16 % — ABNORMAL HIGH (ref 11.0–14.6)
WBC: 6.9 10*3/uL (ref 4.0–10.3)

## 2016-08-11 ENCOUNTER — Telehealth: Payer: Self-pay | Admitting: *Deleted

## 2016-08-11 NOTE — Telephone Encounter (Signed)
Called patient to remind of Xofigo Inj. For 08-12-16- arrival time - 11:45 am @ [facility], spoke with patient's wife - [place] and she is aware of this appt.

## 2016-08-12 ENCOUNTER — Ambulatory Visit (HOSPITAL_COMMUNITY)
Admission: RE | Admit: 2016-08-12 | Discharge: 2016-08-12 | Disposition: A | Discharge status: Home / Self Care | Payer: Medicare Other | Source: Ambulatory Visit | Attending: Radiation Oncology | Admitting: Radiation Oncology

## 2016-08-12 DIAGNOSIS — C7951 Secondary malignant neoplasm of bone: Secondary | ICD-10-CM | POA: Diagnosis not present

## 2016-08-12 DIAGNOSIS — C61 Malignant neoplasm of prostate: Secondary | ICD-10-CM | POA: Diagnosis not present

## 2016-08-12 MED ORDER — RADIUM RA 223 DICHLORIDE 30 MCCI/ML IV SOLN
122.1000 | Freq: Once | INTRAVENOUS | Status: AC
  Administered 2016-08-12: 122.1 via INTRAVENOUS

## 2016-08-19 DIAGNOSIS — C61 Malignant neoplasm of prostate: Secondary | ICD-10-CM | POA: Diagnosis not present

## 2016-08-20 ENCOUNTER — Telehealth: Payer: Self-pay | Admitting: *Deleted

## 2016-08-20 NOTE — Telephone Encounter (Signed)
CALLED PATIENT TO INFORM OF 1 MONTH FU WITH PA, ASHLYN BRUNING ON 09-15-16 @ 1 PM, SPOKE WITH PATIENT'S WIFE AND SHE IS AWARE OF THIS APPT.

## 2016-08-24 ENCOUNTER — Other Ambulatory Visit (HOSPITAL_COMMUNITY): Payer: Self-pay | Admitting: Geriatric Medicine

## 2016-08-24 DIAGNOSIS — M79605 Pain in left leg: Secondary | ICD-10-CM

## 2016-08-24 DIAGNOSIS — M7989 Other specified soft tissue disorders: Secondary | ICD-10-CM | POA: Diagnosis not present

## 2016-08-24 DIAGNOSIS — I10 Essential (primary) hypertension: Secondary | ICD-10-CM | POA: Diagnosis not present

## 2016-08-25 ENCOUNTER — Ambulatory Visit (HOSPITAL_COMMUNITY)
Admission: RE | Admit: 2016-08-25 | Discharge: 2016-08-25 | Disposition: A | Discharge status: Home / Self Care | Payer: Medicare Other | Source: Ambulatory Visit | Attending: Geriatric Medicine | Admitting: Geriatric Medicine

## 2016-08-25 DIAGNOSIS — R9721 Rising PSA following treatment for malignant neoplasm of prostate: Secondary | ICD-10-CM | POA: Diagnosis not present

## 2016-08-25 DIAGNOSIS — I82513 Chronic embolism and thrombosis of femoral vein, bilateral: Secondary | ICD-10-CM | POA: Diagnosis not present

## 2016-08-25 DIAGNOSIS — M7989 Other specified soft tissue disorders: Secondary | ICD-10-CM | POA: Insufficient documentation

## 2016-08-25 DIAGNOSIS — M79605 Pain in left leg: Secondary | ICD-10-CM

## 2016-08-25 DIAGNOSIS — C7951 Secondary malignant neoplasm of bone: Secondary | ICD-10-CM | POA: Diagnosis not present

## 2016-08-25 DIAGNOSIS — C61 Malignant neoplasm of prostate: Secondary | ICD-10-CM | POA: Diagnosis not present

## 2016-08-25 NOTE — Progress Notes (Signed)
VASCULAR LAB PRELIMINARY  PRELIMINARY  PRELIMINARY  PRELIMINARY  Left lower extremity venous duplex completed.    Preliminary report:  Left - technically difficult due to swelling and tenderness upon compression especially in the thigh. There is no obvious evidence of acute DVT ,superfical thrombosis, or Baker's cyst. There appears to be chronic DVT noted in the common femoral and femoral veins. The femoral vein appears to be diminished in size. The left iliac appears patent.Right - No evidence of acute DVT. superficial thrombosis, or Baker's cyst. There appears to be chronic thrombus in the femoral vein.  Vaniah Chambers, RVS 08/25/2016, 11:57 AM

## 2016-09-03 DIAGNOSIS — R600 Localized edema: Secondary | ICD-10-CM | POA: Diagnosis not present

## 2016-09-03 DIAGNOSIS — I10 Essential (primary) hypertension: Secondary | ICD-10-CM | POA: Diagnosis not present

## 2016-09-03 DIAGNOSIS — D509 Iron deficiency anemia, unspecified: Secondary | ICD-10-CM | POA: Diagnosis not present

## 2016-09-07 DIAGNOSIS — D509 Iron deficiency anemia, unspecified: Secondary | ICD-10-CM | POA: Diagnosis not present

## 2016-09-08 DIAGNOSIS — E7800 Pure hypercholesterolemia, unspecified: Secondary | ICD-10-CM | POA: Diagnosis not present

## 2016-09-08 DIAGNOSIS — I87009 Postthrombotic syndrome without complications of unspecified extremity: Secondary | ICD-10-CM | POA: Diagnosis not present

## 2016-09-08 DIAGNOSIS — D509 Iron deficiency anemia, unspecified: Secondary | ICD-10-CM | POA: Diagnosis not present

## 2016-09-08 DIAGNOSIS — Z7902 Long term (current) use of antithrombotics/antiplatelets: Secondary | ICD-10-CM | POA: Diagnosis not present

## 2016-09-08 DIAGNOSIS — Z8546 Personal history of malignant neoplasm of prostate: Secondary | ICD-10-CM | POA: Diagnosis not present

## 2016-09-08 DIAGNOSIS — Z7982 Long term (current) use of aspirin: Secondary | ICD-10-CM | POA: Diagnosis not present

## 2016-09-08 DIAGNOSIS — I82503 Chronic embolism and thrombosis of unspecified deep veins of lower extremity, bilateral: Secondary | ICD-10-CM | POA: Diagnosis not present

## 2016-09-08 DIAGNOSIS — I10 Essential (primary) hypertension: Secondary | ICD-10-CM | POA: Diagnosis not present

## 2016-09-08 DIAGNOSIS — Z9181 History of falling: Secondary | ICD-10-CM | POA: Diagnosis not present

## 2016-09-08 DIAGNOSIS — Z8673 Personal history of transient ischemic attack (TIA), and cerebral infarction without residual deficits: Secondary | ICD-10-CM | POA: Diagnosis not present

## 2016-09-10 DIAGNOSIS — E7800 Pure hypercholesterolemia, unspecified: Secondary | ICD-10-CM | POA: Diagnosis not present

## 2016-09-10 DIAGNOSIS — I82503 Chronic embolism and thrombosis of unspecified deep veins of lower extremity, bilateral: Secondary | ICD-10-CM | POA: Diagnosis not present

## 2016-09-10 DIAGNOSIS — Z7902 Long term (current) use of antithrombotics/antiplatelets: Secondary | ICD-10-CM | POA: Diagnosis not present

## 2016-09-10 DIAGNOSIS — D509 Iron deficiency anemia, unspecified: Secondary | ICD-10-CM | POA: Diagnosis not present

## 2016-09-10 DIAGNOSIS — I10 Essential (primary) hypertension: Secondary | ICD-10-CM | POA: Diagnosis not present

## 2016-09-10 DIAGNOSIS — I87009 Postthrombotic syndrome without complications of unspecified extremity: Secondary | ICD-10-CM | POA: Diagnosis not present

## 2016-09-14 DIAGNOSIS — Z7902 Long term (current) use of antithrombotics/antiplatelets: Secondary | ICD-10-CM | POA: Diagnosis not present

## 2016-09-14 DIAGNOSIS — I10 Essential (primary) hypertension: Secondary | ICD-10-CM | POA: Diagnosis not present

## 2016-09-14 DIAGNOSIS — I82503 Chronic embolism and thrombosis of unspecified deep veins of lower extremity, bilateral: Secondary | ICD-10-CM | POA: Diagnosis not present

## 2016-09-14 DIAGNOSIS — I87009 Postthrombotic syndrome without complications of unspecified extremity: Secondary | ICD-10-CM | POA: Diagnosis not present

## 2016-09-14 DIAGNOSIS — D509 Iron deficiency anemia, unspecified: Secondary | ICD-10-CM | POA: Diagnosis not present

## 2016-09-14 DIAGNOSIS — E7800 Pure hypercholesterolemia, unspecified: Secondary | ICD-10-CM | POA: Diagnosis not present

## 2016-09-14 NOTE — Progress Notes (Signed)
[name] 83 y.o. 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  Pain:None. Reports swelling in left leg and thigh very tender to touch, PCP had home health nurse do a wrap not using the wrap now it did decrease the swelling. Fatigue:Reports fatigue always during the day, takes naps. Appetite:Fair appetite eating two meals per day.  Usually eats a good breakfast and snacks.  Plans to start drinking ensure. Urinary/Bowel issues:  Reports urinary frequency,urinary urgency,having incontinence.  Reports he is not sure if he is emptying of his bladder. Pt states he gets up to urinate 3  times per night. Urologist:Dr. Diona Fanti Had a fall last Wednesday received a skin tear to his left hand which is healing without signs of infection. 07-28-16 Dr. Alen Blew  I have recommended supportive care only moving forward and 66 develops symptomatic progression of his cancer consideration for Zytiga or Gillermina Phy would be considered vs hospice care. I do not believe that Zytiga or Gillermina Phy will offer significant improvement in his quality of life or extending his overall survival. But these would be considered if he is interested in additional therapy other than hospice.  Androgen depravation: I have recommended continuing this for the time being.   Anemia: Likely related to marrow suppression from previous radiation therapy and recent Xofigo. His hemoglobin is adequate at this time does not require transfusion Wt Readings from Last 3 Encounters:  09/15/16 184 lb (83.5 kg)  07/28/16 185 lb 6.4 oz (84.1 kg)  07/15/16 174 lb 12.8 oz (79.3 kg)  BP (!) 124/52   Pulse 88   Temp 97.7 F (36.5 C) (Oral)   Resp 18   Ht 5\' 7"  (1.702 m)   Wt 184 lb (83.5 kg)   SpO2 100%   BMI 28.82 kg/m

## 2016-09-15 ENCOUNTER — Encounter: Payer: Self-pay | Admitting: Radiation Oncology

## 2016-09-15 ENCOUNTER — Ambulatory Visit
Admission: RE | Admit: 2016-09-15 | Discharge: 2016-09-15 | Disposition: A | Discharge status: Home / Self Care | Payer: Medicare Other | Source: Ambulatory Visit | Attending: Radiation Oncology | Admitting: Radiation Oncology

## 2016-09-15 VITALS — BP 124/52 | HR 88 | Temp 97.7°F | Resp 18 | Ht 67.0 in | Wt 184.0 lb

## 2016-09-15 DIAGNOSIS — C775 Secondary and unspecified malignant neoplasm of intrapelvic lymph nodes: Secondary | ICD-10-CM | POA: Diagnosis not present

## 2016-09-15 DIAGNOSIS — C61 Malignant neoplasm of prostate: Secondary | ICD-10-CM | POA: Diagnosis not present

## 2016-09-15 DIAGNOSIS — R600 Localized edema: Secondary | ICD-10-CM | POA: Diagnosis not present

## 2016-09-15 DIAGNOSIS — R590 Localized enlarged lymph nodes: Secondary | ICD-10-CM | POA: Insufficient documentation

## 2016-09-15 DIAGNOSIS — Z79899 Other long term (current) drug therapy: Secondary | ICD-10-CM | POA: Insufficient documentation

## 2016-09-15 DIAGNOSIS — Z510 Encounter for antineoplastic radiation therapy: Secondary | ICD-10-CM | POA: Insufficient documentation

## 2016-09-15 DIAGNOSIS — C7951 Secondary malignant neoplasm of bone: Secondary | ICD-10-CM

## 2016-09-15 DIAGNOSIS — Z888 Allergy status to other drugs, medicaments and biological substances status: Secondary | ICD-10-CM | POA: Diagnosis not present

## 2016-09-15 DIAGNOSIS — Z7982 Long term (current) use of aspirin: Secondary | ICD-10-CM | POA: Insufficient documentation

## 2016-09-16 DIAGNOSIS — I82503 Chronic embolism and thrombosis of unspecified deep veins of lower extremity, bilateral: Secondary | ICD-10-CM | POA: Diagnosis not present

## 2016-09-16 DIAGNOSIS — I87009 Postthrombotic syndrome without complications of unspecified extremity: Secondary | ICD-10-CM | POA: Diagnosis not present

## 2016-09-16 DIAGNOSIS — I10 Essential (primary) hypertension: Secondary | ICD-10-CM | POA: Diagnosis not present

## 2016-09-16 DIAGNOSIS — E7800 Pure hypercholesterolemia, unspecified: Secondary | ICD-10-CM | POA: Diagnosis not present

## 2016-09-16 DIAGNOSIS — Z7902 Long term (current) use of antithrombotics/antiplatelets: Secondary | ICD-10-CM | POA: Diagnosis not present

## 2016-09-16 DIAGNOSIS — D509 Iron deficiency anemia, unspecified: Secondary | ICD-10-CM | POA: Diagnosis not present

## 2016-09-16 NOTE — Progress Notes (Signed)
Radiation Oncology         (336) [phone] ________________________________  Name: MARQUELL SAENZ MRN: [id number]  Date: 09/15/2016  DOB: 04/19/1922  Post Treatment Note  CC: Mathews Argyle, MD  Franchot Gallo, MD  Diagnosis:   83 y.o. gentleman with a history of  castrate resistant metastatic prostate cancer.  Interval Since Last Radiation:  5 weeks   The patient received 6 infusions of Xofigo (RA-223) between 01/28/17 and 08/12/16  Narrative:  The patient returns today for routine follow-up. The patient has recovered from the effects of Xofigo, and he reports that he is feeling pretty well except for his left lower extremity. his treatments were somewhat prolonged due to anemia and fatigue however he completed his course without intervention of blood products or systemic therapy. He states that in the last few weeks, he has had increasing edema in his left thigh, and reports that he has had home health care up to the house to evalute this. He also was evaluated by Doppler ultrasound by his primary care provider which per report was negative for acute DVT but showed some old clot. He continues on Plavix and Asprin.                      On review of systems, the patient states he continues to have pain in his left thigh and is having trouble with swelling that has gradually increased over months. He denies any new joint or skeletal aches or pains. He denies chest pain, shortness of breath, fevers, or chills. No other complaints are verbalized.  ALLERGIES:  is allergic to coumadin [warfarin sodium]; adhesive [tape]; and sulfa antibiotics.  Meds: Current Outpatient Prescriptions  Medication Sig Dispense Refill  . amLODipine (NORVASC) 5 MG tablet Take 5 mg by mouth every morning.     Marland Kitchen aspirin EC 81 MG tablet Take 81 mg by mouth every morning.     Marland Kitchen atorvastatin (LIPITOR) 40 MG tablet Take 40 mg by mouth every morning.     . bicalutamide (CASODEX) 50 MG tablet Take 50 mg by mouth every  morning.    . Calcium Carbonate-Vitamin D (CALCIUM PLUS VITAMIN D PO) Take by mouth.    . clopidogrel (PLAVIX) 75 MG tablet Take 1 tablet (75 mg total) by mouth daily. 30 tablet 0  . ferrous sulfate 325 (65 FE) MG tablet Take 325 mg by mouth daily with breakfast.    . hydrochlorothiazide (HYDRODIURIL) 25 MG tablet Take 25 mg by mouth every morning.     . diphenhydrAMINE (BENADRYL) 25 mg capsule Take 1 capsule (25 mg total) by mouth every 6 (six) hours as needed. 15 capsule 0  . nitroGLYCERIN (NITROSTAT) 0.4 MG SL tablet Place 0.4 mg under the tongue every 5 (five) minutes as needed for chest pain.     No current facility-administered medications for this encounter.     Physical Findings:  height is '5\' 7"'$  (1.702 m) and weight is 184 lb (83.5 kg). His oral temperature is 97.7 F (36.5 C). His blood pressure is 124/52 (abnormal) and his pulse is 88. His respiration is 18 and oxygen saturation is 100%.  Pain Assessment Pain Score: 0-No pain/10 In general this is a well appearing elderly Caucasianmal in no acute distress. he's alert and oriented x4 and appropriate throughout the examination. Cardiopulmonary assessment is negative for acute distress and he exhibits normal effort.  The left lower extremity is evaluated , the patient is unable to remove his cancer this  time given discomfort and limited mobility however he does have what appears to be 2+ pitting edema between the level of the knee and hip on the left anteriorly. This extends distally as well to his lower extremity on the left and into the foot. No deep calf tenderness is noted.  Lab Findings: Lab Results  Component Value Date   WBC 6.9 08/05/2016   HGB 9.4 (L) 08/05/2016   HCT 29.9 (L) 08/05/2016   MCV 95.2 08/05/2016   PLT 139 (L) 08/05/2016     Radiographic Findings: No results found.  Impression/Plan: 1. 83 y.o. gentleman with a history of  Castrate resistant metastatic prostate cancer.  After evaluating the patient today,  I'm concerned that he's developed lymphedema in the anterior left thigh , and in reviewing his history from his last imaging of the abdomen and pelvis in July 2017, he did have pelvic adenopathy. He  Last met with medical oncology in February and at that time there discussion was for pursuing hospice care versus consideration of salvage treatment with Gillermina Phy or Zytiga if he became symptomatic from his disease.  We discussed the rationale of additional imaging with a bone scan andCT of the abdomen and pelvis to understand his disease status, and follow-up with Dr. Alen Blew sedating discuss whether or not he is still a candidate to consider additional systemic therapy.  He is in agreement with this,and we have discussed allowing home health to reassess him and try other forms of compression. He states agreement and understanding.     Carola Rhine, PAC

## 2016-09-17 ENCOUNTER — Telehealth: Payer: Self-pay | Admitting: *Deleted

## 2016-09-17 DIAGNOSIS — D509 Iron deficiency anemia, unspecified: Secondary | ICD-10-CM | POA: Diagnosis not present

## 2016-09-17 DIAGNOSIS — I10 Essential (primary) hypertension: Secondary | ICD-10-CM | POA: Diagnosis not present

## 2016-09-17 DIAGNOSIS — E7800 Pure hypercholesterolemia, unspecified: Secondary | ICD-10-CM | POA: Diagnosis not present

## 2016-09-17 DIAGNOSIS — I87009 Postthrombotic syndrome without complications of unspecified extremity: Secondary | ICD-10-CM | POA: Diagnosis not present

## 2016-09-17 DIAGNOSIS — I82503 Chronic embolism and thrombosis of unspecified deep veins of lower extremity, bilateral: Secondary | ICD-10-CM | POA: Diagnosis not present

## 2016-09-17 DIAGNOSIS — Z7902 Long term (current) use of antithrombotics/antiplatelets: Secondary | ICD-10-CM | POA: Diagnosis not present

## 2016-09-17 NOTE — Telephone Encounter (Signed)
Called patient to inform of bone scan and CT for 09-21-16 @ [facility], spoke with patient's wife and she is aware of these tests

## 2016-09-20 ENCOUNTER — Other Ambulatory Visit: Payer: Self-pay | Admitting: Radiation Oncology

## 2016-09-20 DIAGNOSIS — C61 Malignant neoplasm of prostate: Secondary | ICD-10-CM

## 2016-09-21 ENCOUNTER — Encounter (HOSPITAL_COMMUNITY)
Admission: RE | Admit: 2016-09-21 | Discharge: 2016-09-21 | Disposition: A | Discharge status: Home / Self Care | Payer: Medicare Other | Source: Ambulatory Visit | Attending: Radiation Oncology | Admitting: Radiation Oncology

## 2016-09-21 ENCOUNTER — Other Ambulatory Visit: Payer: Self-pay | Admitting: Radiation Oncology

## 2016-09-21 ENCOUNTER — Ambulatory Visit (HOSPITAL_COMMUNITY)
Admission: RE | Admit: 2016-09-21 | Discharge: 2016-09-21 | Disposition: A | Discharge status: Home / Self Care | Payer: Medicare Other | Source: Ambulatory Visit | Attending: Radiation Oncology | Admitting: Radiation Oncology

## 2016-09-21 DIAGNOSIS — R590 Localized enlarged lymph nodes: Secondary | ICD-10-CM | POA: Insufficient documentation

## 2016-09-21 DIAGNOSIS — R9349 Abnormal radiologic findings on diagnostic imaging of other urinary organs: Secondary | ICD-10-CM | POA: Insufficient documentation

## 2016-09-21 DIAGNOSIS — C61 Malignant neoplasm of prostate: Secondary | ICD-10-CM

## 2016-09-21 DIAGNOSIS — N1330 Unspecified hydronephrosis: Secondary | ICD-10-CM | POA: Insufficient documentation

## 2016-09-21 DIAGNOSIS — Z960 Presence of urogenital implants: Secondary | ICD-10-CM | POA: Diagnosis not present

## 2016-09-21 DIAGNOSIS — C7951 Secondary malignant neoplasm of bone: Secondary | ICD-10-CM | POA: Diagnosis not present

## 2016-09-21 LAB — POCT I-STAT CREATININE: CREATININE: 2.7 mg/dL — AB (ref 0.61–1.24)

## 2016-09-21 MED ORDER — IOPAMIDOL (ISOVUE-300) INJECTION 61%
INTRAVENOUS | Status: AC
  Filled 2016-09-21: qty 100

## 2016-09-21 MED ORDER — TECHNETIUM TC 99M MEDRONATE IV KIT
21.7000 | PACK | Freq: Once | INTRAVENOUS | Status: AC | PRN
  Administered 2016-09-21: 21.7 via INTRAVENOUS

## 2016-09-22 DIAGNOSIS — I10 Essential (primary) hypertension: Secondary | ICD-10-CM | POA: Diagnosis not present

## 2016-09-22 DIAGNOSIS — E7800 Pure hypercholesterolemia, unspecified: Secondary | ICD-10-CM | POA: Diagnosis not present

## 2016-09-22 DIAGNOSIS — I87009 Postthrombotic syndrome without complications of unspecified extremity: Secondary | ICD-10-CM | POA: Diagnosis not present

## 2016-09-22 DIAGNOSIS — D509 Iron deficiency anemia, unspecified: Secondary | ICD-10-CM | POA: Diagnosis not present

## 2016-09-22 DIAGNOSIS — I82503 Chronic embolism and thrombosis of unspecified deep veins of lower extremity, bilateral: Secondary | ICD-10-CM | POA: Diagnosis not present

## 2016-09-22 DIAGNOSIS — I872 Venous insufficiency (chronic) (peripheral): Secondary | ICD-10-CM | POA: Diagnosis not present

## 2016-09-22 DIAGNOSIS — Z7902 Long term (current) use of antithrombotics/antiplatelets: Secondary | ICD-10-CM | POA: Diagnosis not present

## 2016-09-23 DIAGNOSIS — I87009 Postthrombotic syndrome without complications of unspecified extremity: Secondary | ICD-10-CM | POA: Diagnosis not present

## 2016-09-23 DIAGNOSIS — I10 Essential (primary) hypertension: Secondary | ICD-10-CM | POA: Diagnosis not present

## 2016-09-23 DIAGNOSIS — D509 Iron deficiency anemia, unspecified: Secondary | ICD-10-CM | POA: Diagnosis not present

## 2016-09-23 DIAGNOSIS — E7800 Pure hypercholesterolemia, unspecified: Secondary | ICD-10-CM | POA: Diagnosis not present

## 2016-09-23 DIAGNOSIS — Z7902 Long term (current) use of antithrombotics/antiplatelets: Secondary | ICD-10-CM | POA: Diagnosis not present

## 2016-09-23 DIAGNOSIS — I82503 Chronic embolism and thrombosis of unspecified deep veins of lower extremity, bilateral: Secondary | ICD-10-CM | POA: Diagnosis not present

## 2016-09-24 DIAGNOSIS — Z7902 Long term (current) use of antithrombotics/antiplatelets: Secondary | ICD-10-CM | POA: Diagnosis not present

## 2016-09-24 DIAGNOSIS — D509 Iron deficiency anemia, unspecified: Secondary | ICD-10-CM | POA: Diagnosis not present

## 2016-09-24 DIAGNOSIS — I87009 Postthrombotic syndrome without complications of unspecified extremity: Secondary | ICD-10-CM | POA: Diagnosis not present

## 2016-09-24 DIAGNOSIS — I82503 Chronic embolism and thrombosis of unspecified deep veins of lower extremity, bilateral: Secondary | ICD-10-CM | POA: Diagnosis not present

## 2016-09-24 DIAGNOSIS — E7800 Pure hypercholesterolemia, unspecified: Secondary | ICD-10-CM | POA: Diagnosis not present

## 2016-09-24 DIAGNOSIS — I10 Essential (primary) hypertension: Secondary | ICD-10-CM | POA: Diagnosis not present

## 2016-09-28 DIAGNOSIS — D509 Iron deficiency anemia, unspecified: Secondary | ICD-10-CM | POA: Diagnosis not present

## 2016-09-28 DIAGNOSIS — C7951 Secondary malignant neoplasm of bone: Secondary | ICD-10-CM | POA: Diagnosis not present

## 2016-09-28 DIAGNOSIS — Z7902 Long term (current) use of antithrombotics/antiplatelets: Secondary | ICD-10-CM | POA: Diagnosis not present

## 2016-09-28 DIAGNOSIS — I82503 Chronic embolism and thrombosis of unspecified deep veins of lower extremity, bilateral: Secondary | ICD-10-CM | POA: Diagnosis not present

## 2016-09-28 DIAGNOSIS — E7800 Pure hypercholesterolemia, unspecified: Secondary | ICD-10-CM | POA: Diagnosis not present

## 2016-09-28 DIAGNOSIS — I10 Essential (primary) hypertension: Secondary | ICD-10-CM | POA: Diagnosis not present

## 2016-09-28 DIAGNOSIS — Z5111 Encounter for antineoplastic chemotherapy: Secondary | ICD-10-CM | POA: Diagnosis not present

## 2016-09-28 DIAGNOSIS — I87009 Postthrombotic syndrome without complications of unspecified extremity: Secondary | ICD-10-CM | POA: Diagnosis not present

## 2016-09-28 DIAGNOSIS — C61 Malignant neoplasm of prostate: Secondary | ICD-10-CM | POA: Diagnosis not present

## 2016-09-29 DIAGNOSIS — Z7902 Long term (current) use of antithrombotics/antiplatelets: Secondary | ICD-10-CM | POA: Diagnosis not present

## 2016-09-29 DIAGNOSIS — I87009 Postthrombotic syndrome without complications of unspecified extremity: Secondary | ICD-10-CM | POA: Diagnosis not present

## 2016-09-29 DIAGNOSIS — D509 Iron deficiency anemia, unspecified: Secondary | ICD-10-CM | POA: Diagnosis not present

## 2016-09-29 DIAGNOSIS — I10 Essential (primary) hypertension: Secondary | ICD-10-CM | POA: Diagnosis not present

## 2016-09-29 DIAGNOSIS — E7800 Pure hypercholesterolemia, unspecified: Secondary | ICD-10-CM | POA: Diagnosis not present

## 2016-09-29 DIAGNOSIS — I82503 Chronic embolism and thrombosis of unspecified deep veins of lower extremity, bilateral: Secondary | ICD-10-CM | POA: Diagnosis not present

## 2016-09-30 DIAGNOSIS — E7800 Pure hypercholesterolemia, unspecified: Secondary | ICD-10-CM | POA: Diagnosis not present

## 2016-09-30 DIAGNOSIS — I82503 Chronic embolism and thrombosis of unspecified deep veins of lower extremity, bilateral: Secondary | ICD-10-CM | POA: Diagnosis not present

## 2016-09-30 DIAGNOSIS — Z7902 Long term (current) use of antithrombotics/antiplatelets: Secondary | ICD-10-CM | POA: Diagnosis not present

## 2016-09-30 DIAGNOSIS — I10 Essential (primary) hypertension: Secondary | ICD-10-CM | POA: Diagnosis not present

## 2016-09-30 DIAGNOSIS — D509 Iron deficiency anemia, unspecified: Secondary | ICD-10-CM | POA: Diagnosis not present

## 2016-09-30 DIAGNOSIS — I87009 Postthrombotic syndrome without complications of unspecified extremity: Secondary | ICD-10-CM | POA: Diagnosis not present

## 2016-10-04 DIAGNOSIS — I10 Essential (primary) hypertension: Secondary | ICD-10-CM | POA: Diagnosis not present

## 2016-10-04 DIAGNOSIS — D509 Iron deficiency anemia, unspecified: Secondary | ICD-10-CM | POA: Diagnosis not present

## 2016-10-04 DIAGNOSIS — I87009 Postthrombotic syndrome without complications of unspecified extremity: Secondary | ICD-10-CM | POA: Diagnosis not present

## 2016-10-04 DIAGNOSIS — Z7902 Long term (current) use of antithrombotics/antiplatelets: Secondary | ICD-10-CM | POA: Diagnosis not present

## 2016-10-04 DIAGNOSIS — E7800 Pure hypercholesterolemia, unspecified: Secondary | ICD-10-CM | POA: Diagnosis not present

## 2016-10-04 DIAGNOSIS — I82503 Chronic embolism and thrombosis of unspecified deep veins of lower extremity, bilateral: Secondary | ICD-10-CM | POA: Diagnosis not present

## 2016-10-07 DIAGNOSIS — D509 Iron deficiency anemia, unspecified: Secondary | ICD-10-CM | POA: Diagnosis not present

## 2016-10-07 DIAGNOSIS — I10 Essential (primary) hypertension: Secondary | ICD-10-CM | POA: Diagnosis not present

## 2016-10-07 DIAGNOSIS — Z7902 Long term (current) use of antithrombotics/antiplatelets: Secondary | ICD-10-CM | POA: Diagnosis not present

## 2016-10-07 DIAGNOSIS — I87009 Postthrombotic syndrome without complications of unspecified extremity: Secondary | ICD-10-CM | POA: Diagnosis not present

## 2016-10-07 DIAGNOSIS — E7800 Pure hypercholesterolemia, unspecified: Secondary | ICD-10-CM | POA: Diagnosis not present

## 2016-10-07 DIAGNOSIS — I82503 Chronic embolism and thrombosis of unspecified deep veins of lower extremity, bilateral: Secondary | ICD-10-CM | POA: Diagnosis not present

## 2016-10-08 DIAGNOSIS — I10 Essential (primary) hypertension: Secondary | ICD-10-CM | POA: Diagnosis not present

## 2016-10-08 DIAGNOSIS — I82503 Chronic embolism and thrombosis of unspecified deep veins of lower extremity, bilateral: Secondary | ICD-10-CM | POA: Diagnosis not present

## 2016-10-08 DIAGNOSIS — D509 Iron deficiency anemia, unspecified: Secondary | ICD-10-CM | POA: Diagnosis not present

## 2016-10-08 DIAGNOSIS — Z7902 Long term (current) use of antithrombotics/antiplatelets: Secondary | ICD-10-CM | POA: Diagnosis not present

## 2016-10-08 DIAGNOSIS — E7800 Pure hypercholesterolemia, unspecified: Secondary | ICD-10-CM | POA: Diagnosis not present

## 2016-10-08 DIAGNOSIS — I87009 Postthrombotic syndrome without complications of unspecified extremity: Secondary | ICD-10-CM | POA: Diagnosis not present

## 2016-10-14 DIAGNOSIS — I10 Essential (primary) hypertension: Secondary | ICD-10-CM | POA: Diagnosis not present

## 2016-10-14 DIAGNOSIS — I87009 Postthrombotic syndrome without complications of unspecified extremity: Secondary | ICD-10-CM | POA: Diagnosis not present

## 2016-10-14 DIAGNOSIS — D509 Iron deficiency anemia, unspecified: Secondary | ICD-10-CM | POA: Diagnosis not present

## 2016-10-14 DIAGNOSIS — Z7902 Long term (current) use of antithrombotics/antiplatelets: Secondary | ICD-10-CM | POA: Diagnosis not present

## 2016-10-14 DIAGNOSIS — I82503 Chronic embolism and thrombosis of unspecified deep veins of lower extremity, bilateral: Secondary | ICD-10-CM | POA: Diagnosis not present

## 2016-10-14 DIAGNOSIS — E7800 Pure hypercholesterolemia, unspecified: Secondary | ICD-10-CM | POA: Diagnosis not present

## 2016-10-15 DIAGNOSIS — I82503 Chronic embolism and thrombosis of unspecified deep veins of lower extremity, bilateral: Secondary | ICD-10-CM | POA: Diagnosis not present

## 2016-10-15 DIAGNOSIS — Z7902 Long term (current) use of antithrombotics/antiplatelets: Secondary | ICD-10-CM | POA: Diagnosis not present

## 2016-10-15 DIAGNOSIS — I10 Essential (primary) hypertension: Secondary | ICD-10-CM | POA: Diagnosis not present

## 2016-10-15 DIAGNOSIS — E7800 Pure hypercholesterolemia, unspecified: Secondary | ICD-10-CM | POA: Diagnosis not present

## 2016-10-15 DIAGNOSIS — I87009 Postthrombotic syndrome without complications of unspecified extremity: Secondary | ICD-10-CM | POA: Diagnosis not present

## 2016-10-15 DIAGNOSIS — D509 Iron deficiency anemia, unspecified: Secondary | ICD-10-CM | POA: Diagnosis not present

## 2016-10-18 ENCOUNTER — Telehealth: Payer: Self-pay | Admitting: *Deleted

## 2016-10-18 ENCOUNTER — Encounter: Payer: Self-pay | Admitting: Radiation Oncology

## 2016-10-18 ENCOUNTER — Telehealth: Payer: Self-pay | Admitting: Radiation Oncology

## 2016-10-18 ENCOUNTER — Encounter: Payer: Self-pay | Admitting: *Deleted

## 2016-10-18 NOTE — Progress Notes (Signed)
Received return call from [place], [place] for Dr. Alen Blew. Verbally informed Shona Simpson, PA-C that per [facility] Dr. Alen Blew recommends supportive care only for this patient.  Explained that Dr. Alen Blew recommended she review his last note. She verbalized understanding.

## 2016-10-18 NOTE — Telephone Encounter (Signed)
Worthy Flank NP in rad onc calling to ask if dr Alen Blew is planning to start patient on zytiga or xtandi? Note to dr Alen Blew.

## 2016-10-18 NOTE — Telephone Encounter (Signed)
I called the patient to review the results from his CT in April. I had been under the impression he was already considering treatment with medical oncology but they have recommended supportive care. He is symptomatic from adenopathy in the pelvis/retroperitoneum with lymphedema. He is interested in therapy for this and Dr. Tammi Klippel has recommended meeting to offer him radiotherapy to the nodes involved. Scheduling will be notifying the patient of an appointment time.

## 2016-10-18 NOTE — Telephone Encounter (Signed)
Lm for samantha presnell rn, to let [facility] know that dr Alen Blew recommends supportive care only. No further treatment and to please see his last note.

## 2016-10-18 NOTE — Telephone Encounter (Signed)
I have reccommended supportive care only and no additional treatment. Please see my last note.

## 2016-10-20 DIAGNOSIS — Z7902 Long term (current) use of antithrombotics/antiplatelets: Secondary | ICD-10-CM | POA: Diagnosis not present

## 2016-10-20 DIAGNOSIS — E7800 Pure hypercholesterolemia, unspecified: Secondary | ICD-10-CM | POA: Diagnosis not present

## 2016-10-20 DIAGNOSIS — I87009 Postthrombotic syndrome without complications of unspecified extremity: Secondary | ICD-10-CM | POA: Diagnosis not present

## 2016-10-20 DIAGNOSIS — I82503 Chronic embolism and thrombosis of unspecified deep veins of lower extremity, bilateral: Secondary | ICD-10-CM | POA: Diagnosis not present

## 2016-10-20 DIAGNOSIS — I10 Essential (primary) hypertension: Secondary | ICD-10-CM | POA: Diagnosis not present

## 2016-10-20 DIAGNOSIS — D509 Iron deficiency anemia, unspecified: Secondary | ICD-10-CM | POA: Diagnosis not present

## 2016-10-21 ENCOUNTER — Ambulatory Visit: Admission: RE | Admit: 2016-10-21 | Payer: Medicare Other | Source: Ambulatory Visit | Admitting: Radiation Oncology

## 2016-10-21 ENCOUNTER — Telehealth: Payer: Self-pay | Admitting: Radiation Oncology

## 2016-10-21 ENCOUNTER — Ambulatory Visit: Payer: Medicare Other

## 2016-10-21 ENCOUNTER — Ambulatory Visit
Admission: RE | Admit: 2016-10-21 | Discharge: 2016-10-21 | Disposition: A | Discharge status: Home / Self Care | Payer: Medicare Other | Source: Ambulatory Visit | Attending: Radiation Oncology | Admitting: Radiation Oncology

## 2016-10-21 DIAGNOSIS — D509 Iron deficiency anemia, unspecified: Secondary | ICD-10-CM | POA: Diagnosis not present

## 2016-10-21 DIAGNOSIS — I87009 Postthrombotic syndrome without complications of unspecified extremity: Secondary | ICD-10-CM | POA: Diagnosis not present

## 2016-10-21 DIAGNOSIS — I10 Essential (primary) hypertension: Secondary | ICD-10-CM | POA: Diagnosis not present

## 2016-10-21 DIAGNOSIS — Z7902 Long term (current) use of antithrombotics/antiplatelets: Secondary | ICD-10-CM | POA: Diagnosis not present

## 2016-10-21 DIAGNOSIS — I82503 Chronic embolism and thrombosis of unspecified deep veins of lower extremity, bilateral: Secondary | ICD-10-CM | POA: Diagnosis not present

## 2016-10-21 DIAGNOSIS — E7800 Pure hypercholesterolemia, unspecified: Secondary | ICD-10-CM | POA: Diagnosis not present

## 2016-10-21 NOTE — Telephone Encounter (Signed)
I spoke with the patient's wife to reschedule his appointment for recon

## 2016-10-27 ENCOUNTER — Encounter: Payer: Self-pay | Admitting: Radiation Oncology

## 2016-10-27 ENCOUNTER — Ambulatory Visit
Admission: RE | Admit: 2016-10-27 | Discharge: 2016-10-27 | Disposition: A | Discharge status: Home / Self Care | Payer: Medicare Other | Source: Ambulatory Visit | Attending: Radiation Oncology | Admitting: Radiation Oncology

## 2016-10-27 ENCOUNTER — Ambulatory Visit: Admission: RE | Admit: 2016-10-27 | Payer: Medicare Other | Source: Ambulatory Visit | Admitting: Radiation Oncology

## 2016-10-27 VITALS — BP 138/48 | HR 92 | Temp 98.2°F | Resp 18 | Ht 67.0 in | Wt 180.0 lb

## 2016-10-27 DIAGNOSIS — Z510 Encounter for antineoplastic radiation therapy: Secondary | ICD-10-CM | POA: Diagnosis not present

## 2016-10-27 DIAGNOSIS — C7951 Secondary malignant neoplasm of bone: Secondary | ICD-10-CM

## 2016-10-27 DIAGNOSIS — Z79899 Other long term (current) drug therapy: Secondary | ICD-10-CM | POA: Diagnosis not present

## 2016-10-27 DIAGNOSIS — C775 Secondary and unspecified malignant neoplasm of intrapelvic lymph nodes: Secondary | ICD-10-CM

## 2016-10-27 DIAGNOSIS — C61 Malignant neoplasm of prostate: Secondary | ICD-10-CM | POA: Diagnosis not present

## 2016-10-27 DIAGNOSIS — R590 Localized enlarged lymph nodes: Secondary | ICD-10-CM | POA: Diagnosis not present

## 2016-10-27 DIAGNOSIS — R911 Solitary pulmonary nodule: Secondary | ICD-10-CM | POA: Diagnosis not present

## 2016-10-27 DIAGNOSIS — Z79818 Long term (current) use of other agents affecting estrogen receptors and estrogen levels: Secondary | ICD-10-CM | POA: Diagnosis not present

## 2016-10-27 DIAGNOSIS — Z888 Allergy status to other drugs, medicaments and biological substances status: Secondary | ICD-10-CM | POA: Diagnosis not present

## 2016-10-27 DIAGNOSIS — R600 Localized edema: Secondary | ICD-10-CM | POA: Diagnosis not present

## 2016-10-27 DIAGNOSIS — Z7982 Long term (current) use of aspirin: Secondary | ICD-10-CM | POA: Diagnosis not present

## 2016-10-27 DIAGNOSIS — Z712 Person consulting for explanation of examination or test findings: Secondary | ICD-10-CM | POA: Diagnosis not present

## 2016-10-27 NOTE — Progress Notes (Signed)
Histology and Location of Primary Cancer: Castration resistant prostate cancer 3+4 diagnosed in January 2002 with painful bone involvement - Stage IV  Sites of Visceral and Bony Metastatic Disease: right ninth rib laterally, perivascular nodule at the right lung base,Progressive retroperitoneal and pelvic lymphadenopathy   Location(s) of Symptomatic Metastases: Left lower leg,left hip  Past/Anticipated chemotherapy by medical oncology, if any: Xofigo injections, started on androgen deprivation therapy May 04, 2014 receiving 240 mg of Firmagon, first Lupron injection was one month later  Pain on a scale of 0-10 is: None   If Spine Met(s), symptoms, if any, include:Spine  Bowel/Bladder retention or incontinence (please describe): Incontinent of bladder wears an adult brief  Numbness or weakness in extremities (please describe):Ambulates with a cane and walker,wheeler, has weakness of legs at times  Current Decadron regimen, if applicable: No  Ambulatory status? Walker? Wheelchair?: Ambulatory with cane,wakler and wheeler  SAFETY ISSUES:  Prior radiation? Yes; by Danny Lawless 07/14/2001  Pacemaker/ICD? no  Possible current pregnancy? no  Is the patient on methotrexate? no  Current Complaints / other details:  83 year old male. Married for 65 years. AX: Plavix, adhesive tape, sulfa.   Wt Readings from Last 3 Encounters:  10/27/16 180 lb (81.6 kg)  09/15/16 184 lb (83.5 kg)  07/28/16 185 lb 6.4 oz (84.1 kg)  BP (!) 138/48   Pulse 92   Temp 98.2 F (36.8 C) (Oral)   Resp 18   Ht '5\' 7"'$  (1.702 m)   Wt 180 lb (81.6 kg)   SpO2 100%   BMI 28.19 kg/m
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
## 2016-10-27 NOTE — Addendum Note (Signed)
Encounter addended by: Tyler Pita, MD on: 10/27/2016  8:28 PM<BR>    Actions taken: Diagnosis association updated, Order list changed, Visit Navigator Flowsheet section accepted

## 2016-10-28 ENCOUNTER — Ambulatory Visit
Admission: RE | Admit: 2016-10-28 | Discharge: 2016-10-28 | Disposition: A | Discharge status: Home / Self Care | Payer: Medicare Other | Source: Ambulatory Visit | Attending: Radiation Oncology | Admitting: Radiation Oncology

## 2016-10-28 ENCOUNTER — Other Ambulatory Visit: Payer: Self-pay | Admitting: Urology

## 2016-10-28 DIAGNOSIS — C61 Malignant neoplasm of prostate: Secondary | ICD-10-CM | POA: Diagnosis not present

## 2016-10-28 DIAGNOSIS — C775 Secondary and unspecified malignant neoplasm of intrapelvic lymph nodes: Secondary | ICD-10-CM

## 2016-10-28 DIAGNOSIS — C7951 Secondary malignant neoplasm of bone: Secondary | ICD-10-CM | POA: Diagnosis not present

## 2016-10-28 DIAGNOSIS — Z7982 Long term (current) use of aspirin: Secondary | ICD-10-CM | POA: Diagnosis not present

## 2016-10-28 DIAGNOSIS — R600 Localized edema: Secondary | ICD-10-CM | POA: Diagnosis not present

## 2016-10-28 DIAGNOSIS — Z888 Allergy status to other drugs, medicaments and biological substances status: Secondary | ICD-10-CM | POA: Diagnosis not present

## 2016-10-28 DIAGNOSIS — Z510 Encounter for antineoplastic radiation therapy: Secondary | ICD-10-CM | POA: Diagnosis not present

## 2016-10-28 NOTE — Progress Notes (Signed)
  Radiation Oncology         [phone]) [phone] ________________________________  Name: [name] MRN: [id number]  Date: 10/28/2016  DOB: February 10, 1922  SIMULATION AND TREATMENT PLANNING NOTE    ICD-9-CM ICD-10-CM   1. Malignant neoplasm of prostate ([place]) 185 C61   2. Cancer of intrapelvic lymph nodes, secondary (HCC) 196.6 C77.5     DIAGNOSIS:  83 y.o. gentleman with a history of  castrate resistant metastatic prostate cancer and leg edema from enlarged pelvic adenopathy  NARRATIVE:  The patient was brought to the [place].  Identity was confirmed.  All relevant records and images related to the planned course of therapy were reviewed.  The patient freely provided informed written consent to proceed with treatment after reviewing the details related to the planned course of therapy. The consent form was witnessed and verified by the simulation staff.  Then, the patient was set-up in a stable reproducible  supine position for radiation therapy.  CT images were obtained.  Surface markings were placed.  The CT images were loaded into the planning software.  Then the target and avoidance structures were contoured.  Treatment planning then occurred.  The radiation prescription was entered and confirmed.  Then, I designed and supervised the construction of a total of 1 medically necessary complex treatment devices.  I have requested : 3D Simulation  I have requested a DVH of the following structures: bladder, left femur, right femur, rectum and small bowel plus targets.  PLAN:  The patient will receive 30 Gy in 10 fractions to the pelvic/retroperitoneal adenopathy.  ________________________________  Sheral Apley Tammi Klippel, M.D.  This document serves as a record of services personally performed by Tyler Pita, MD. It was created on his behalf by Darcus Austin, a trained medical scribe. The creation of this record is based on the scribe's personal observations and the provider's  statements to them. This document has been checked and approved by the attending provider.

## 2016-10-29 LAB — PSA: PROSTATE SPECIFIC AG, SERUM: 43 ng/mL — AB (ref 0.0–4.0)

## 2016-11-01 DIAGNOSIS — Z510 Encounter for antineoplastic radiation therapy: Secondary | ICD-10-CM | POA: Diagnosis not present

## 2016-11-01 DIAGNOSIS — C775 Secondary and unspecified malignant neoplasm of intrapelvic lymph nodes: Secondary | ICD-10-CM | POA: Diagnosis not present

## 2016-11-01 DIAGNOSIS — I87009 Postthrombotic syndrome without complications of unspecified extremity: Secondary | ICD-10-CM | POA: Diagnosis not present

## 2016-11-01 DIAGNOSIS — I10 Essential (primary) hypertension: Secondary | ICD-10-CM | POA: Diagnosis not present

## 2016-11-01 DIAGNOSIS — E7800 Pure hypercholesterolemia, unspecified: Secondary | ICD-10-CM | POA: Diagnosis not present

## 2016-11-01 DIAGNOSIS — Z7902 Long term (current) use of antithrombotics/antiplatelets: Secondary | ICD-10-CM | POA: Diagnosis not present

## 2016-11-01 DIAGNOSIS — I82503 Chronic embolism and thrombosis of unspecified deep veins of lower extremity, bilateral: Secondary | ICD-10-CM | POA: Diagnosis not present

## 2016-11-01 DIAGNOSIS — C7951 Secondary malignant neoplasm of bone: Secondary | ICD-10-CM | POA: Diagnosis not present

## 2016-11-01 DIAGNOSIS — Z7982 Long term (current) use of aspirin: Secondary | ICD-10-CM | POA: Diagnosis not present

## 2016-11-01 DIAGNOSIS — R600 Localized edema: Secondary | ICD-10-CM | POA: Diagnosis not present

## 2016-11-01 DIAGNOSIS — Z888 Allergy status to other drugs, medicaments and biological substances status: Secondary | ICD-10-CM | POA: Diagnosis not present

## 2016-11-01 DIAGNOSIS — D509 Iron deficiency anemia, unspecified: Secondary | ICD-10-CM | POA: Diagnosis not present

## 2016-11-01 DIAGNOSIS — C61 Malignant neoplasm of prostate: Secondary | ICD-10-CM | POA: Diagnosis not present

## 2016-11-03 ENCOUNTER — Ambulatory Visit: Admission: RE | Admit: 2016-11-03 | Payer: Medicare Other | Source: Ambulatory Visit | Admitting: Radiation Oncology

## 2016-11-03 DIAGNOSIS — I10 Essential (primary) hypertension: Secondary | ICD-10-CM | POA: Diagnosis not present

## 2016-11-03 DIAGNOSIS — E7800 Pure hypercholesterolemia, unspecified: Secondary | ICD-10-CM | POA: Diagnosis not present

## 2016-11-03 DIAGNOSIS — D509 Iron deficiency anemia, unspecified: Secondary | ICD-10-CM | POA: Diagnosis not present

## 2016-11-03 DIAGNOSIS — I82503 Chronic embolism and thrombosis of unspecified deep veins of lower extremity, bilateral: Secondary | ICD-10-CM | POA: Diagnosis not present

## 2016-11-03 DIAGNOSIS — I87009 Postthrombotic syndrome without complications of unspecified extremity: Secondary | ICD-10-CM | POA: Diagnosis not present

## 2016-11-03 DIAGNOSIS — Z7902 Long term (current) use of antithrombotics/antiplatelets: Secondary | ICD-10-CM | POA: Diagnosis not present

## 2016-11-04 ENCOUNTER — Telehealth: Payer: Self-pay | Admitting: Radiation Oncology

## 2016-11-04 ENCOUNTER — Ambulatory Visit: Payer: Medicare Other

## 2016-11-04 NOTE — Telephone Encounter (Signed)
I called the patient and the phone rang without picking up voicemail. Our office will call him again to try and reach him.

## 2016-11-07 DIAGNOSIS — Z7982 Long term (current) use of aspirin: Secondary | ICD-10-CM | POA: Diagnosis not present

## 2016-11-07 DIAGNOSIS — Z9181 History of falling: Secondary | ICD-10-CM | POA: Diagnosis not present

## 2016-11-07 DIAGNOSIS — D509 Iron deficiency anemia, unspecified: Secondary | ICD-10-CM | POA: Diagnosis not present

## 2016-11-07 DIAGNOSIS — I87009 Postthrombotic syndrome without complications of unspecified extremity: Secondary | ICD-10-CM | POA: Diagnosis not present

## 2016-11-07 DIAGNOSIS — I82502 Chronic embolism and thrombosis of unspecified deep veins of left lower extremity: Secondary | ICD-10-CM | POA: Diagnosis not present

## 2016-11-07 DIAGNOSIS — I82503 Chronic embolism and thrombosis of unspecified deep veins of lower extremity, bilateral: Secondary | ICD-10-CM | POA: Diagnosis not present

## 2016-11-07 DIAGNOSIS — Z7902 Long term (current) use of antithrombotics/antiplatelets: Secondary | ICD-10-CM | POA: Diagnosis not present

## 2016-11-07 DIAGNOSIS — I69354 Hemiplegia and hemiparesis following cerebral infarction affecting left non-dominant side: Secondary | ICD-10-CM | POA: Diagnosis not present

## 2016-11-07 DIAGNOSIS — Z8546 Personal history of malignant neoplasm of prostate: Secondary | ICD-10-CM | POA: Diagnosis not present

## 2016-11-07 DIAGNOSIS — E7800 Pure hypercholesterolemia, unspecified: Secondary | ICD-10-CM | POA: Diagnosis not present

## 2016-11-07 DIAGNOSIS — I10 Essential (primary) hypertension: Secondary | ICD-10-CM | POA: Diagnosis not present

## 2016-11-08 ENCOUNTER — Ambulatory Visit: Payer: Medicare Other

## 2016-11-09 ENCOUNTER — Ambulatory Visit: Payer: Medicare Other

## 2016-11-09 DIAGNOSIS — I69354 Hemiplegia and hemiparesis following cerebral infarction affecting left non-dominant side: Secondary | ICD-10-CM | POA: Diagnosis not present

## 2016-11-09 DIAGNOSIS — I87009 Postthrombotic syndrome without complications of unspecified extremity: Secondary | ICD-10-CM | POA: Diagnosis not present

## 2016-11-09 DIAGNOSIS — I82503 Chronic embolism and thrombosis of unspecified deep veins of lower extremity, bilateral: Secondary | ICD-10-CM | POA: Diagnosis not present

## 2016-11-09 DIAGNOSIS — I10 Essential (primary) hypertension: Secondary | ICD-10-CM | POA: Diagnosis not present

## 2016-11-09 DIAGNOSIS — E7800 Pure hypercholesterolemia, unspecified: Secondary | ICD-10-CM | POA: Diagnosis not present

## 2016-11-09 DIAGNOSIS — D509 Iron deficiency anemia, unspecified: Secondary | ICD-10-CM | POA: Diagnosis not present

## 2016-11-10 ENCOUNTER — Ambulatory Visit: Payer: Medicare Other

## 2016-11-11 ENCOUNTER — Ambulatory Visit: Payer: Medicare Other

## 2016-11-11 DIAGNOSIS — E7800 Pure hypercholesterolemia, unspecified: Secondary | ICD-10-CM | POA: Diagnosis not present

## 2016-11-11 DIAGNOSIS — D509 Iron deficiency anemia, unspecified: Secondary | ICD-10-CM | POA: Diagnosis not present

## 2016-11-11 DIAGNOSIS — I10 Essential (primary) hypertension: Secondary | ICD-10-CM | POA: Diagnosis not present

## 2016-11-11 DIAGNOSIS — I82503 Chronic embolism and thrombosis of unspecified deep veins of lower extremity, bilateral: Secondary | ICD-10-CM | POA: Diagnosis not present

## 2016-11-11 DIAGNOSIS — I87009 Postthrombotic syndrome without complications of unspecified extremity: Secondary | ICD-10-CM | POA: Diagnosis not present

## 2016-11-11 DIAGNOSIS — I69354 Hemiplegia and hemiparesis following cerebral infarction affecting left non-dominant side: Secondary | ICD-10-CM | POA: Diagnosis not present

## 2016-11-12 ENCOUNTER — Ambulatory Visit: Payer: Medicare Other

## 2016-11-15 ENCOUNTER — Ambulatory Visit: Payer: Medicare Other

## 2016-11-15 DIAGNOSIS — I69354 Hemiplegia and hemiparesis following cerebral infarction affecting left non-dominant side: Secondary | ICD-10-CM | POA: Diagnosis not present

## 2016-11-15 DIAGNOSIS — I87009 Postthrombotic syndrome without complications of unspecified extremity: Secondary | ICD-10-CM | POA: Diagnosis not present

## 2016-11-15 DIAGNOSIS — D509 Iron deficiency anemia, unspecified: Secondary | ICD-10-CM | POA: Diagnosis not present

## 2016-11-15 DIAGNOSIS — I82503 Chronic embolism and thrombosis of unspecified deep veins of lower extremity, bilateral: Secondary | ICD-10-CM | POA: Diagnosis not present

## 2016-11-15 DIAGNOSIS — E7800 Pure hypercholesterolemia, unspecified: Secondary | ICD-10-CM | POA: Diagnosis not present

## 2016-11-15 DIAGNOSIS — I10 Essential (primary) hypertension: Secondary | ICD-10-CM | POA: Diagnosis not present

## 2016-11-16 ENCOUNTER — Encounter: Payer: Self-pay | Admitting: Radiation Oncology

## 2016-11-16 ENCOUNTER — Telehealth: Payer: Self-pay | Admitting: Radiation Oncology

## 2016-11-16 ENCOUNTER — Ambulatory Visit: Payer: Medicare Other | Admitting: Radiation Oncology

## 2016-11-16 ENCOUNTER — Ambulatory Visit: Payer: Medicare Other

## 2016-11-16 DIAGNOSIS — C775 Secondary and unspecified malignant neoplasm of intrapelvic lymph nodes: Secondary | ICD-10-CM

## 2016-11-16 DIAGNOSIS — C61 Malignant neoplasm of prostate: Secondary | ICD-10-CM

## 2016-11-16 NOTE — Telephone Encounter (Signed)
I spoke with the patient's wife. She reports he is certain that he does not want to continue any radiation. He continues with home health care, however he does not have any additional ancillary care. They initially declined palliative care consultation because they were confused about the role since they already had home health care set up. We discussed the utility of hospice care as he will not be pursuing additional local (XRT) or systemic therapy for his cancer. The patient's wife will discuss this with her husband, and requests referral to meet with hospice and discuss their resources he may benefit from. I will place an order and notify scheduling.     Carola Rhine, PAC

## 2016-11-17 ENCOUNTER — Ambulatory Visit: Payer: Medicare Other | Admitting: Radiation Oncology

## 2016-11-17 ENCOUNTER — Ambulatory Visit: Payer: Medicare Other

## 2016-11-17 DIAGNOSIS — I82503 Chronic embolism and thrombosis of unspecified deep veins of lower extremity, bilateral: Secondary | ICD-10-CM | POA: Diagnosis not present

## 2016-11-17 DIAGNOSIS — E7800 Pure hypercholesterolemia, unspecified: Secondary | ICD-10-CM | POA: Diagnosis not present

## 2016-11-17 DIAGNOSIS — I69354 Hemiplegia and hemiparesis following cerebral infarction affecting left non-dominant side: Secondary | ICD-10-CM | POA: Diagnosis not present

## 2016-11-17 DIAGNOSIS — D509 Iron deficiency anemia, unspecified: Secondary | ICD-10-CM | POA: Diagnosis not present

## 2016-11-17 DIAGNOSIS — I87009 Postthrombotic syndrome without complications of unspecified extremity: Secondary | ICD-10-CM | POA: Diagnosis not present

## 2016-11-17 DIAGNOSIS — I10 Essential (primary) hypertension: Secondary | ICD-10-CM | POA: Diagnosis not present

## 2016-11-18 DIAGNOSIS — D649 Anemia, unspecified: Secondary | ICD-10-CM | POA: Diagnosis not present

## 2016-11-18 NOTE — Progress Notes (Signed)
  Radiation Oncology         [phone]) [phone] ________________________________  Name: ANTONIN MEININGER MRN: [id number]  Date: 11/16/2016  DOB: November 10, 1921  End of Treatment Note  Diagnosis:   83 y.o. gentleman with a history of  castrate resistant metastatic prostate cancer  Indication for treatment:  Palliative  Radiation treatment dates:   N/A  Site/dose:  The patient was expected to have 30 Gy in 10 fractions to the pelvic internal iliac nodes.  Beams/energy:   3D // 15X, 6X Photon  Narrative: The patient was unable to tolerate the treatment position due to pain. The patient decided not to proceed with radiation and we agreed.  Plan: The patient and his wife will discuss Hospice Care. An order has been placed for hospice and scheduling was notified. Follow up PRN. ________________________________  Sheral Apley Tammi Klippel, M.D.  This document serves as a record of services personally performed by Tyler Pita, MD. It was created on his behalf by Darcus Austin, a trained medical scribe. The creation of this record is based on the scribe's personal observations and the provider's statements to them. This document has been checked and approved by the attending provider.

## 2016-11-19 DIAGNOSIS — R9721 Rising PSA following treatment for malignant neoplasm of prostate: Secondary | ICD-10-CM | POA: Diagnosis not present

## 2016-11-19 DIAGNOSIS — N1339 Other hydronephrosis: Secondary | ICD-10-CM | POA: Diagnosis not present

## 2016-11-19 DIAGNOSIS — C7951 Secondary malignant neoplasm of bone: Secondary | ICD-10-CM | POA: Diagnosis not present

## 2016-11-19 DIAGNOSIS — C61 Malignant neoplasm of prostate: Secondary | ICD-10-CM | POA: Diagnosis not present

## 2016-11-20 DIAGNOSIS — I679 Cerebrovascular disease, unspecified: Secondary | ICD-10-CM | POA: Diagnosis not present

## 2016-11-20 DIAGNOSIS — I25119 Atherosclerotic heart disease of native coronary artery with unspecified angina pectoris: Secondary | ICD-10-CM | POA: Diagnosis not present

## 2016-11-20 DIAGNOSIS — C61 Malignant neoplasm of prostate: Secondary | ICD-10-CM | POA: Diagnosis not present

## 2016-11-20 DIAGNOSIS — C775 Secondary and unspecified malignant neoplasm of intrapelvic lymph nodes: Secondary | ICD-10-CM | POA: Diagnosis not present

## 2016-11-20 DIAGNOSIS — D630 Anemia in neoplastic disease: Secondary | ICD-10-CM | POA: Diagnosis not present

## 2016-11-20 DIAGNOSIS — C7951 Secondary malignant neoplasm of bone: Secondary | ICD-10-CM | POA: Diagnosis not present

## 2016-11-21 DIAGNOSIS — D630 Anemia in neoplastic disease: Secondary | ICD-10-CM | POA: Diagnosis not present

## 2016-11-21 DIAGNOSIS — C775 Secondary and unspecified malignant neoplasm of intrapelvic lymph nodes: Secondary | ICD-10-CM | POA: Diagnosis not present

## 2016-11-21 DIAGNOSIS — I25119 Atherosclerotic heart disease of native coronary artery with unspecified angina pectoris: Secondary | ICD-10-CM | POA: Diagnosis not present

## 2016-11-21 DIAGNOSIS — C61 Malignant neoplasm of prostate: Secondary | ICD-10-CM | POA: Diagnosis not present

## 2016-11-21 DIAGNOSIS — C7951 Secondary malignant neoplasm of bone: Secondary | ICD-10-CM | POA: Diagnosis not present

## 2016-11-21 DIAGNOSIS — I679 Cerebrovascular disease, unspecified: Secondary | ICD-10-CM | POA: Diagnosis not present

## 2016-11-22 DIAGNOSIS — I25119 Atherosclerotic heart disease of native coronary artery with unspecified angina pectoris: Secondary | ICD-10-CM | POA: Diagnosis not present

## 2016-11-22 DIAGNOSIS — C7951 Secondary malignant neoplasm of bone: Secondary | ICD-10-CM | POA: Diagnosis not present

## 2016-11-22 DIAGNOSIS — C61 Malignant neoplasm of prostate: Secondary | ICD-10-CM | POA: Diagnosis not present

## 2016-11-22 DIAGNOSIS — I679 Cerebrovascular disease, unspecified: Secondary | ICD-10-CM | POA: Diagnosis not present

## 2016-11-22 DIAGNOSIS — C775 Secondary and unspecified malignant neoplasm of intrapelvic lymph nodes: Secondary | ICD-10-CM | POA: Diagnosis not present

## 2016-11-22 DIAGNOSIS — D630 Anemia in neoplastic disease: Secondary | ICD-10-CM | POA: Diagnosis not present

## 2016-11-23 DIAGNOSIS — C775 Secondary and unspecified malignant neoplasm of intrapelvic lymph nodes: Secondary | ICD-10-CM | POA: Diagnosis not present

## 2016-11-23 DIAGNOSIS — D630 Anemia in neoplastic disease: Secondary | ICD-10-CM | POA: Diagnosis not present

## 2016-11-23 DIAGNOSIS — I25119 Atherosclerotic heart disease of native coronary artery with unspecified angina pectoris: Secondary | ICD-10-CM | POA: Diagnosis not present

## 2016-11-23 DIAGNOSIS — I679 Cerebrovascular disease, unspecified: Secondary | ICD-10-CM | POA: Diagnosis not present

## 2016-11-23 DIAGNOSIS — C61 Malignant neoplasm of prostate: Secondary | ICD-10-CM | POA: Diagnosis not present

## 2016-11-23 DIAGNOSIS — C7951 Secondary malignant neoplasm of bone: Secondary | ICD-10-CM | POA: Diagnosis not present

## 2016-11-24 DIAGNOSIS — C7951 Secondary malignant neoplasm of bone: Secondary | ICD-10-CM | POA: Diagnosis not present

## 2016-11-24 DIAGNOSIS — D630 Anemia in neoplastic disease: Secondary | ICD-10-CM | POA: Diagnosis not present

## 2016-11-24 DIAGNOSIS — C61 Malignant neoplasm of prostate: Secondary | ICD-10-CM | POA: Diagnosis not present

## 2016-11-24 DIAGNOSIS — C775 Secondary and unspecified malignant neoplasm of intrapelvic lymph nodes: Secondary | ICD-10-CM | POA: Diagnosis not present

## 2016-11-24 DIAGNOSIS — I679 Cerebrovascular disease, unspecified: Secondary | ICD-10-CM | POA: Diagnosis not present

## 2016-11-24 DIAGNOSIS — I25119 Atherosclerotic heart disease of native coronary artery with unspecified angina pectoris: Secondary | ICD-10-CM | POA: Diagnosis not present

## 2016-11-25 ENCOUNTER — Encounter: Payer: Self-pay | Admitting: *Deleted

## 2016-11-26 DIAGNOSIS — C7951 Secondary malignant neoplasm of bone: Secondary | ICD-10-CM | POA: Diagnosis not present

## 2016-11-26 DIAGNOSIS — D630 Anemia in neoplastic disease: Secondary | ICD-10-CM | POA: Diagnosis not present

## 2016-11-26 DIAGNOSIS — C775 Secondary and unspecified malignant neoplasm of intrapelvic lymph nodes: Secondary | ICD-10-CM | POA: Diagnosis not present

## 2016-11-26 DIAGNOSIS — I25119 Atherosclerotic heart disease of native coronary artery with unspecified angina pectoris: Secondary | ICD-10-CM | POA: Diagnosis not present

## 2016-11-26 DIAGNOSIS — I679 Cerebrovascular disease, unspecified: Secondary | ICD-10-CM | POA: Diagnosis not present

## 2016-11-26 DIAGNOSIS — C61 Malignant neoplasm of prostate: Secondary | ICD-10-CM | POA: Diagnosis not present

## 2016-11-28 DIAGNOSIS — C7951 Secondary malignant neoplasm of bone: Secondary | ICD-10-CM | POA: Diagnosis not present

## 2016-11-28 DIAGNOSIS — D630 Anemia in neoplastic disease: Secondary | ICD-10-CM | POA: Diagnosis not present

## 2016-11-28 DIAGNOSIS — I25119 Atherosclerotic heart disease of native coronary artery with unspecified angina pectoris: Secondary | ICD-10-CM | POA: Diagnosis not present

## 2016-11-28 DIAGNOSIS — C775 Secondary and unspecified malignant neoplasm of intrapelvic lymph nodes: Secondary | ICD-10-CM | POA: Diagnosis not present

## 2016-11-28 DIAGNOSIS — I679 Cerebrovascular disease, unspecified: Secondary | ICD-10-CM | POA: Diagnosis not present

## 2016-11-28 DIAGNOSIS — C61 Malignant neoplasm of prostate: Secondary | ICD-10-CM | POA: Diagnosis not present

## 2016-11-29 DIAGNOSIS — I25119 Atherosclerotic heart disease of native coronary artery with unspecified angina pectoris: Secondary | ICD-10-CM | POA: Diagnosis not present

## 2016-11-29 DIAGNOSIS — C7951 Secondary malignant neoplasm of bone: Secondary | ICD-10-CM | POA: Diagnosis not present

## 2016-11-29 DIAGNOSIS — C775 Secondary and unspecified malignant neoplasm of intrapelvic lymph nodes: Secondary | ICD-10-CM | POA: Diagnosis not present

## 2016-11-29 DIAGNOSIS — D630 Anemia in neoplastic disease: Secondary | ICD-10-CM | POA: Diagnosis not present

## 2016-11-29 DIAGNOSIS — C61 Malignant neoplasm of prostate: Secondary | ICD-10-CM | POA: Diagnosis not present

## 2016-11-29 DIAGNOSIS — I679 Cerebrovascular disease, unspecified: Secondary | ICD-10-CM | POA: Diagnosis not present

## 2016-11-30 DIAGNOSIS — C775 Secondary and unspecified malignant neoplasm of intrapelvic lymph nodes: Secondary | ICD-10-CM | POA: Diagnosis not present

## 2016-11-30 DIAGNOSIS — C7951 Secondary malignant neoplasm of bone: Secondary | ICD-10-CM | POA: Diagnosis not present

## 2016-11-30 DIAGNOSIS — D630 Anemia in neoplastic disease: Secondary | ICD-10-CM | POA: Diagnosis not present

## 2016-11-30 DIAGNOSIS — I679 Cerebrovascular disease, unspecified: Secondary | ICD-10-CM | POA: Diagnosis not present

## 2016-11-30 DIAGNOSIS — C61 Malignant neoplasm of prostate: Secondary | ICD-10-CM | POA: Diagnosis not present

## 2016-11-30 DIAGNOSIS — I25119 Atherosclerotic heart disease of native coronary artery with unspecified angina pectoris: Secondary | ICD-10-CM | POA: Diagnosis not present

## 2016-12-02 DIAGNOSIS — I679 Cerebrovascular disease, unspecified: Secondary | ICD-10-CM | POA: Diagnosis not present

## 2016-12-02 DIAGNOSIS — C7951 Secondary malignant neoplasm of bone: Secondary | ICD-10-CM | POA: Diagnosis not present

## 2016-12-02 DIAGNOSIS — C775 Secondary and unspecified malignant neoplasm of intrapelvic lymph nodes: Secondary | ICD-10-CM | POA: Diagnosis not present

## 2016-12-02 DIAGNOSIS — D630 Anemia in neoplastic disease: Secondary | ICD-10-CM | POA: Diagnosis not present

## 2016-12-02 DIAGNOSIS — C61 Malignant neoplasm of prostate: Secondary | ICD-10-CM | POA: Diagnosis not present

## 2016-12-02 DIAGNOSIS — I25119 Atherosclerotic heart disease of native coronary artery with unspecified angina pectoris: Secondary | ICD-10-CM | POA: Diagnosis not present

## 2016-12-03 DIAGNOSIS — I679 Cerebrovascular disease, unspecified: Secondary | ICD-10-CM | POA: Diagnosis not present

## 2016-12-03 DIAGNOSIS — C7951 Secondary malignant neoplasm of bone: Secondary | ICD-10-CM | POA: Diagnosis not present

## 2016-12-03 DIAGNOSIS — D630 Anemia in neoplastic disease: Secondary | ICD-10-CM | POA: Diagnosis not present

## 2016-12-03 DIAGNOSIS — C61 Malignant neoplasm of prostate: Secondary | ICD-10-CM | POA: Diagnosis not present

## 2016-12-03 DIAGNOSIS — C775 Secondary and unspecified malignant neoplasm of intrapelvic lymph nodes: Secondary | ICD-10-CM | POA: Diagnosis not present

## 2016-12-03 DIAGNOSIS — I25119 Atherosclerotic heart disease of native coronary artery with unspecified angina pectoris: Secondary | ICD-10-CM | POA: Diagnosis not present

## 2016-12-07 DIAGNOSIS — C7951 Secondary malignant neoplasm of bone: Secondary | ICD-10-CM | POA: Diagnosis not present

## 2016-12-07 DIAGNOSIS — C61 Malignant neoplasm of prostate: Secondary | ICD-10-CM | POA: Diagnosis not present

## 2016-12-07 DIAGNOSIS — I679 Cerebrovascular disease, unspecified: Secondary | ICD-10-CM | POA: Diagnosis not present

## 2016-12-07 DIAGNOSIS — C775 Secondary and unspecified malignant neoplasm of intrapelvic lymph nodes: Secondary | ICD-10-CM | POA: Diagnosis not present

## 2016-12-07 DIAGNOSIS — I25119 Atherosclerotic heart disease of native coronary artery with unspecified angina pectoris: Secondary | ICD-10-CM | POA: Diagnosis not present

## 2016-12-07 DIAGNOSIS — D630 Anemia in neoplastic disease: Secondary | ICD-10-CM | POA: Diagnosis not present

## 2016-12-08 DIAGNOSIS — I679 Cerebrovascular disease, unspecified: Secondary | ICD-10-CM | POA: Diagnosis not present

## 2016-12-08 DIAGNOSIS — C7951 Secondary malignant neoplasm of bone: Secondary | ICD-10-CM | POA: Diagnosis not present

## 2016-12-08 DIAGNOSIS — D630 Anemia in neoplastic disease: Secondary | ICD-10-CM | POA: Diagnosis not present

## 2016-12-08 DIAGNOSIS — C775 Secondary and unspecified malignant neoplasm of intrapelvic lymph nodes: Secondary | ICD-10-CM | POA: Diagnosis not present

## 2016-12-08 DIAGNOSIS — C61 Malignant neoplasm of prostate: Secondary | ICD-10-CM | POA: Diagnosis not present

## 2016-12-08 DIAGNOSIS — I25119 Atherosclerotic heart disease of native coronary artery with unspecified angina pectoris: Secondary | ICD-10-CM | POA: Diagnosis not present

## 2016-12-09 DIAGNOSIS — D630 Anemia in neoplastic disease: Secondary | ICD-10-CM | POA: Diagnosis not present

## 2016-12-09 DIAGNOSIS — C775 Secondary and unspecified malignant neoplasm of intrapelvic lymph nodes: Secondary | ICD-10-CM | POA: Diagnosis not present

## 2016-12-09 DIAGNOSIS — C7951 Secondary malignant neoplasm of bone: Secondary | ICD-10-CM | POA: Diagnosis not present

## 2016-12-09 DIAGNOSIS — C61 Malignant neoplasm of prostate: Secondary | ICD-10-CM | POA: Diagnosis not present

## 2016-12-09 DIAGNOSIS — I679 Cerebrovascular disease, unspecified: Secondary | ICD-10-CM | POA: Diagnosis not present

## 2016-12-09 DIAGNOSIS — I25119 Atherosclerotic heart disease of native coronary artery with unspecified angina pectoris: Secondary | ICD-10-CM | POA: Diagnosis not present

## 2016-12-10 DIAGNOSIS — D630 Anemia in neoplastic disease: Secondary | ICD-10-CM | POA: Diagnosis not present

## 2016-12-10 DIAGNOSIS — C7951 Secondary malignant neoplasm of bone: Secondary | ICD-10-CM | POA: Diagnosis not present

## 2016-12-10 DIAGNOSIS — C775 Secondary and unspecified malignant neoplasm of intrapelvic lymph nodes: Secondary | ICD-10-CM | POA: Diagnosis not present

## 2016-12-10 DIAGNOSIS — I679 Cerebrovascular disease, unspecified: Secondary | ICD-10-CM | POA: Diagnosis not present

## 2016-12-10 DIAGNOSIS — I25119 Atherosclerotic heart disease of native coronary artery with unspecified angina pectoris: Secondary | ICD-10-CM | POA: Diagnosis not present

## 2016-12-10 DIAGNOSIS — C61 Malignant neoplasm of prostate: Secondary | ICD-10-CM | POA: Diagnosis not present

## 2016-12-14 DIAGNOSIS — D630 Anemia in neoplastic disease: Secondary | ICD-10-CM | POA: Diagnosis not present

## 2016-12-14 DIAGNOSIS — C61 Malignant neoplasm of prostate: Secondary | ICD-10-CM | POA: Diagnosis not present

## 2016-12-14 DIAGNOSIS — C7951 Secondary malignant neoplasm of bone: Secondary | ICD-10-CM | POA: Diagnosis not present

## 2016-12-14 DIAGNOSIS — I679 Cerebrovascular disease, unspecified: Secondary | ICD-10-CM | POA: Diagnosis not present

## 2016-12-14 DIAGNOSIS — C775 Secondary and unspecified malignant neoplasm of intrapelvic lymph nodes: Secondary | ICD-10-CM | POA: Diagnosis not present

## 2016-12-14 DIAGNOSIS — I25119 Atherosclerotic heart disease of native coronary artery with unspecified angina pectoris: Secondary | ICD-10-CM | POA: Diagnosis not present

## 2016-12-17 ENCOUNTER — Telehealth: Payer: Self-pay | Admitting: *Deleted

## 2016-12-17 ENCOUNTER — Encounter: Payer: Self-pay | Admitting: *Deleted

## 2016-12-17 DIAGNOSIS — C775 Secondary and unspecified malignant neoplasm of intrapelvic lymph nodes: Secondary | ICD-10-CM | POA: Diagnosis not present

## 2016-12-17 DIAGNOSIS — I25119 Atherosclerotic heart disease of native coronary artery with unspecified angina pectoris: Secondary | ICD-10-CM | POA: Diagnosis not present

## 2016-12-17 DIAGNOSIS — C61 Malignant neoplasm of prostate: Secondary | ICD-10-CM | POA: Diagnosis not present

## 2016-12-17 DIAGNOSIS — D630 Anemia in neoplastic disease: Secondary | ICD-10-CM | POA: Diagnosis not present

## 2016-12-17 DIAGNOSIS — C7951 Secondary malignant neoplasm of bone: Secondary | ICD-10-CM | POA: Diagnosis not present

## 2016-12-17 DIAGNOSIS — I679 Cerebrovascular disease, unspecified: Secondary | ICD-10-CM | POA: Diagnosis not present

## 2016-12-17 NOTE — Telephone Encounter (Signed)
Son [name] calling, to ask if patient may get a blood transfusion? States labs were drawn last week and given to dr Lyman Speller and he has not heard anything. .gave my fax # so he may fax a copy of labs to dr shadad's nurse.

## 2016-12-20 DIAGNOSIS — D630 Anemia in neoplastic disease: Secondary | ICD-10-CM | POA: Diagnosis not present

## 2016-12-20 DIAGNOSIS — C775 Secondary and unspecified malignant neoplasm of intrapelvic lymph nodes: Secondary | ICD-10-CM | POA: Diagnosis not present

## 2016-12-20 DIAGNOSIS — I25119 Atherosclerotic heart disease of native coronary artery with unspecified angina pectoris: Secondary | ICD-10-CM | POA: Diagnosis not present

## 2016-12-20 DIAGNOSIS — I679 Cerebrovascular disease, unspecified: Secondary | ICD-10-CM | POA: Diagnosis not present

## 2016-12-20 DIAGNOSIS — C61 Malignant neoplasm of prostate: Secondary | ICD-10-CM | POA: Diagnosis not present

## 2016-12-20 DIAGNOSIS — C7951 Secondary malignant neoplasm of bone: Secondary | ICD-10-CM | POA: Diagnosis not present

## 2016-12-21 DIAGNOSIS — D630 Anemia in neoplastic disease: Secondary | ICD-10-CM | POA: Diagnosis not present

## 2016-12-21 DIAGNOSIS — I679 Cerebrovascular disease, unspecified: Secondary | ICD-10-CM | POA: Diagnosis not present

## 2016-12-21 DIAGNOSIS — C61 Malignant neoplasm of prostate: Secondary | ICD-10-CM | POA: Diagnosis not present

## 2016-12-21 DIAGNOSIS — C775 Secondary and unspecified malignant neoplasm of intrapelvic lymph nodes: Secondary | ICD-10-CM | POA: Diagnosis not present

## 2016-12-21 DIAGNOSIS — I25119 Atherosclerotic heart disease of native coronary artery with unspecified angina pectoris: Secondary | ICD-10-CM | POA: Diagnosis not present

## 2016-12-21 DIAGNOSIS — C7951 Secondary malignant neoplasm of bone: Secondary | ICD-10-CM | POA: Diagnosis not present

## 2016-12-22 DIAGNOSIS — C775 Secondary and unspecified malignant neoplasm of intrapelvic lymph nodes: Secondary | ICD-10-CM | POA: Diagnosis not present

## 2016-12-22 DIAGNOSIS — C7951 Secondary malignant neoplasm of bone: Secondary | ICD-10-CM | POA: Diagnosis not present

## 2016-12-22 DIAGNOSIS — I679 Cerebrovascular disease, unspecified: Secondary | ICD-10-CM | POA: Diagnosis not present

## 2016-12-22 DIAGNOSIS — C61 Malignant neoplasm of prostate: Secondary | ICD-10-CM | POA: Diagnosis not present

## 2016-12-22 DIAGNOSIS — I25119 Atherosclerotic heart disease of native coronary artery with unspecified angina pectoris: Secondary | ICD-10-CM | POA: Diagnosis not present

## 2016-12-22 DIAGNOSIS — D630 Anemia in neoplastic disease: Secondary | ICD-10-CM | POA: Diagnosis not present

## 2016-12-23 DIAGNOSIS — I25119 Atherosclerotic heart disease of native coronary artery with unspecified angina pectoris: Secondary | ICD-10-CM | POA: Diagnosis not present

## 2016-12-23 DIAGNOSIS — D630 Anemia in neoplastic disease: Secondary | ICD-10-CM | POA: Diagnosis not present

## 2016-12-23 DIAGNOSIS — C61 Malignant neoplasm of prostate: Secondary | ICD-10-CM | POA: Diagnosis not present

## 2016-12-23 DIAGNOSIS — C775 Secondary and unspecified malignant neoplasm of intrapelvic lymph nodes: Secondary | ICD-10-CM | POA: Diagnosis not present

## 2016-12-23 DIAGNOSIS — C7951 Secondary malignant neoplasm of bone: Secondary | ICD-10-CM | POA: Diagnosis not present

## 2016-12-23 DIAGNOSIS — I679 Cerebrovascular disease, unspecified: Secondary | ICD-10-CM | POA: Diagnosis not present

## 2016-12-24 DIAGNOSIS — C775 Secondary and unspecified malignant neoplasm of intrapelvic lymph nodes: Secondary | ICD-10-CM | POA: Diagnosis not present

## 2016-12-24 DIAGNOSIS — C61 Malignant neoplasm of prostate: Secondary | ICD-10-CM | POA: Diagnosis not present

## 2016-12-24 DIAGNOSIS — D630 Anemia in neoplastic disease: Secondary | ICD-10-CM | POA: Diagnosis not present

## 2016-12-24 DIAGNOSIS — I679 Cerebrovascular disease, unspecified: Secondary | ICD-10-CM | POA: Diagnosis not present

## 2016-12-24 DIAGNOSIS — C7951 Secondary malignant neoplasm of bone: Secondary | ICD-10-CM | POA: Diagnosis not present

## 2016-12-24 DIAGNOSIS — I25119 Atherosclerotic heart disease of native coronary artery with unspecified angina pectoris: Secondary | ICD-10-CM | POA: Diagnosis not present

## 2016-12-27 DIAGNOSIS — I679 Cerebrovascular disease, unspecified: Secondary | ICD-10-CM | POA: Diagnosis not present

## 2016-12-27 DIAGNOSIS — C7951 Secondary malignant neoplasm of bone: Secondary | ICD-10-CM | POA: Diagnosis not present

## 2016-12-27 DIAGNOSIS — D630 Anemia in neoplastic disease: Secondary | ICD-10-CM | POA: Diagnosis not present

## 2016-12-27 DIAGNOSIS — I25119 Atherosclerotic heart disease of native coronary artery with unspecified angina pectoris: Secondary | ICD-10-CM | POA: Diagnosis not present

## 2016-12-27 DIAGNOSIS — C61 Malignant neoplasm of prostate: Secondary | ICD-10-CM | POA: Diagnosis not present

## 2016-12-27 DIAGNOSIS — C775 Secondary and unspecified malignant neoplasm of intrapelvic lymph nodes: Secondary | ICD-10-CM | POA: Diagnosis not present

## 2016-12-28 DIAGNOSIS — C775 Secondary and unspecified malignant neoplasm of intrapelvic lymph nodes: Secondary | ICD-10-CM | POA: Diagnosis not present

## 2016-12-28 DIAGNOSIS — D630 Anemia in neoplastic disease: Secondary | ICD-10-CM | POA: Diagnosis not present

## 2016-12-28 DIAGNOSIS — I25119 Atherosclerotic heart disease of native coronary artery with unspecified angina pectoris: Secondary | ICD-10-CM | POA: Diagnosis not present

## 2016-12-28 DIAGNOSIS — C7951 Secondary malignant neoplasm of bone: Secondary | ICD-10-CM | POA: Diagnosis not present

## 2016-12-28 DIAGNOSIS — C61 Malignant neoplasm of prostate: Secondary | ICD-10-CM | POA: Diagnosis not present

## 2016-12-28 DIAGNOSIS — I679 Cerebrovascular disease, unspecified: Secondary | ICD-10-CM | POA: Diagnosis not present

## 2016-12-29 DIAGNOSIS — D630 Anemia in neoplastic disease: Secondary | ICD-10-CM | POA: Diagnosis not present

## 2016-12-29 DIAGNOSIS — C775 Secondary and unspecified malignant neoplasm of intrapelvic lymph nodes: Secondary | ICD-10-CM | POA: Diagnosis not present

## 2016-12-29 DIAGNOSIS — C7951 Secondary malignant neoplasm of bone: Secondary | ICD-10-CM | POA: Diagnosis not present

## 2016-12-29 DIAGNOSIS — I25119 Atherosclerotic heart disease of native coronary artery with unspecified angina pectoris: Secondary | ICD-10-CM | POA: Diagnosis not present

## 2016-12-29 DIAGNOSIS — C61 Malignant neoplasm of prostate: Secondary | ICD-10-CM | POA: Diagnosis not present

## 2016-12-29 DIAGNOSIS — I679 Cerebrovascular disease, unspecified: Secondary | ICD-10-CM | POA: Diagnosis not present

## 2016-12-30 DIAGNOSIS — I679 Cerebrovascular disease, unspecified: Secondary | ICD-10-CM | POA: Diagnosis not present

## 2016-12-30 DIAGNOSIS — I25119 Atherosclerotic heart disease of native coronary artery with unspecified angina pectoris: Secondary | ICD-10-CM | POA: Diagnosis not present

## 2016-12-30 DIAGNOSIS — C7951 Secondary malignant neoplasm of bone: Secondary | ICD-10-CM | POA: Diagnosis not present

## 2016-12-30 DIAGNOSIS — C61 Malignant neoplasm of prostate: Secondary | ICD-10-CM | POA: Diagnosis not present

## 2016-12-30 DIAGNOSIS — C775 Secondary and unspecified malignant neoplasm of intrapelvic lymph nodes: Secondary | ICD-10-CM | POA: Diagnosis not present

## 2016-12-30 DIAGNOSIS — D630 Anemia in neoplastic disease: Secondary | ICD-10-CM | POA: Diagnosis not present

## 2016-12-31 DIAGNOSIS — I679 Cerebrovascular disease, unspecified: Secondary | ICD-10-CM | POA: Diagnosis not present

## 2016-12-31 DIAGNOSIS — D630 Anemia in neoplastic disease: Secondary | ICD-10-CM | POA: Diagnosis not present

## 2016-12-31 DIAGNOSIS — C61 Malignant neoplasm of prostate: Secondary | ICD-10-CM | POA: Diagnosis not present

## 2016-12-31 DIAGNOSIS — I25119 Atherosclerotic heart disease of native coronary artery with unspecified angina pectoris: Secondary | ICD-10-CM | POA: Diagnosis not present

## 2016-12-31 DIAGNOSIS — C775 Secondary and unspecified malignant neoplasm of intrapelvic lymph nodes: Secondary | ICD-10-CM | POA: Diagnosis not present

## 2016-12-31 DIAGNOSIS — C7951 Secondary malignant neoplasm of bone: Secondary | ICD-10-CM | POA: Diagnosis not present

## 2017-01-03 DIAGNOSIS — C61 Malignant neoplasm of prostate: Secondary | ICD-10-CM | POA: Diagnosis not present

## 2017-01-03 DIAGNOSIS — I679 Cerebrovascular disease, unspecified: Secondary | ICD-10-CM | POA: Diagnosis not present

## 2017-01-03 DIAGNOSIS — C7951 Secondary malignant neoplasm of bone: Secondary | ICD-10-CM | POA: Diagnosis not present

## 2017-01-03 DIAGNOSIS — D630 Anemia in neoplastic disease: Secondary | ICD-10-CM | POA: Diagnosis not present

## 2017-01-03 DIAGNOSIS — I25119 Atherosclerotic heart disease of native coronary artery with unspecified angina pectoris: Secondary | ICD-10-CM | POA: Diagnosis not present

## 2017-01-03 DIAGNOSIS — C775 Secondary and unspecified malignant neoplasm of intrapelvic lymph nodes: Secondary | ICD-10-CM | POA: Diagnosis not present

## 2017-01-04 DIAGNOSIS — C7951 Secondary malignant neoplasm of bone: Secondary | ICD-10-CM | POA: Diagnosis not present

## 2017-01-04 DIAGNOSIS — I25119 Atherosclerotic heart disease of native coronary artery with unspecified angina pectoris: Secondary | ICD-10-CM | POA: Diagnosis not present

## 2017-01-04 DIAGNOSIS — D630 Anemia in neoplastic disease: Secondary | ICD-10-CM | POA: Diagnosis not present

## 2017-01-04 DIAGNOSIS — I679 Cerebrovascular disease, unspecified: Secondary | ICD-10-CM | POA: Diagnosis not present

## 2017-01-04 DIAGNOSIS — C775 Secondary and unspecified malignant neoplasm of intrapelvic lymph nodes: Secondary | ICD-10-CM | POA: Diagnosis not present

## 2017-01-04 DIAGNOSIS — C61 Malignant neoplasm of prostate: Secondary | ICD-10-CM | POA: Diagnosis not present

## 2017-01-05 DIAGNOSIS — C775 Secondary and unspecified malignant neoplasm of intrapelvic lymph nodes: Secondary | ICD-10-CM | POA: Diagnosis not present

## 2017-01-05 DIAGNOSIS — D630 Anemia in neoplastic disease: Secondary | ICD-10-CM | POA: Diagnosis not present

## 2017-01-05 DIAGNOSIS — I679 Cerebrovascular disease, unspecified: Secondary | ICD-10-CM | POA: Diagnosis not present

## 2017-01-05 DIAGNOSIS — I25119 Atherosclerotic heart disease of native coronary artery with unspecified angina pectoris: Secondary | ICD-10-CM | POA: Diagnosis not present

## 2017-01-05 DIAGNOSIS — C7951 Secondary malignant neoplasm of bone: Secondary | ICD-10-CM | POA: Diagnosis not present

## 2017-01-05 DIAGNOSIS — C61 Malignant neoplasm of prostate: Secondary | ICD-10-CM | POA: Diagnosis not present

## 2017-01-06 DIAGNOSIS — I25119 Atherosclerotic heart disease of native coronary artery with unspecified angina pectoris: Secondary | ICD-10-CM | POA: Diagnosis not present

## 2017-01-06 DIAGNOSIS — D630 Anemia in neoplastic disease: Secondary | ICD-10-CM | POA: Diagnosis not present

## 2017-01-06 DIAGNOSIS — C7951 Secondary malignant neoplasm of bone: Secondary | ICD-10-CM | POA: Diagnosis not present

## 2017-01-06 DIAGNOSIS — I679 Cerebrovascular disease, unspecified: Secondary | ICD-10-CM | POA: Diagnosis not present

## 2017-01-06 DIAGNOSIS — C775 Secondary and unspecified malignant neoplasm of intrapelvic lymph nodes: Secondary | ICD-10-CM | POA: Diagnosis not present

## 2017-01-06 DIAGNOSIS — C61 Malignant neoplasm of prostate: Secondary | ICD-10-CM | POA: Diagnosis not present

## 2017-01-07 DIAGNOSIS — C7951 Secondary malignant neoplasm of bone: Secondary | ICD-10-CM | POA: Diagnosis not present

## 2017-01-07 DIAGNOSIS — D630 Anemia in neoplastic disease: Secondary | ICD-10-CM | POA: Diagnosis not present

## 2017-01-07 DIAGNOSIS — I679 Cerebrovascular disease, unspecified: Secondary | ICD-10-CM | POA: Diagnosis not present

## 2017-01-07 DIAGNOSIS — I25119 Atherosclerotic heart disease of native coronary artery with unspecified angina pectoris: Secondary | ICD-10-CM | POA: Diagnosis not present

## 2017-01-07 DIAGNOSIS — C61 Malignant neoplasm of prostate: Secondary | ICD-10-CM | POA: Diagnosis not present

## 2017-01-07 DIAGNOSIS — C775 Secondary and unspecified malignant neoplasm of intrapelvic lymph nodes: Secondary | ICD-10-CM | POA: Diagnosis not present

## 2017-01-08 DIAGNOSIS — I679 Cerebrovascular disease, unspecified: Secondary | ICD-10-CM | POA: Diagnosis not present

## 2017-01-08 DIAGNOSIS — C61 Malignant neoplasm of prostate: Secondary | ICD-10-CM | POA: Diagnosis not present

## 2017-01-08 DIAGNOSIS — C7951 Secondary malignant neoplasm of bone: Secondary | ICD-10-CM | POA: Diagnosis not present

## 2017-01-08 DIAGNOSIS — D630 Anemia in neoplastic disease: Secondary | ICD-10-CM | POA: Diagnosis not present

## 2017-01-08 DIAGNOSIS — I25119 Atherosclerotic heart disease of native coronary artery with unspecified angina pectoris: Secondary | ICD-10-CM | POA: Diagnosis not present

## 2017-01-08 DIAGNOSIS — C775 Secondary and unspecified malignant neoplasm of intrapelvic lymph nodes: Secondary | ICD-10-CM | POA: Diagnosis not present

## 2017-01-09 DIAGNOSIS — C61 Malignant neoplasm of prostate: Secondary | ICD-10-CM | POA: Diagnosis not present

## 2017-01-09 DIAGNOSIS — I25119 Atherosclerotic heart disease of native coronary artery with unspecified angina pectoris: Secondary | ICD-10-CM | POA: Diagnosis not present

## 2017-01-09 DIAGNOSIS — D630 Anemia in neoplastic disease: Secondary | ICD-10-CM | POA: Diagnosis not present

## 2017-01-09 DIAGNOSIS — C775 Secondary and unspecified malignant neoplasm of intrapelvic lymph nodes: Secondary | ICD-10-CM | POA: Diagnosis not present

## 2017-01-09 DIAGNOSIS — I679 Cerebrovascular disease, unspecified: Secondary | ICD-10-CM | POA: Diagnosis not present

## 2017-01-09 DIAGNOSIS — C7951 Secondary malignant neoplasm of bone: Secondary | ICD-10-CM | POA: Diagnosis not present

## 2017-01-29 DEATH — deceased
